# Patient Record
Sex: Male | Born: 1980 | Race: White | Hispanic: No | Marital: Married | Smoking: Former smoker
Health system: Southern US, Community
[De-identification: ages and names within clinical notes are randomized; demographics above are authoritative.]

## PROBLEM LIST (undated history)

## (undated) DIAGNOSIS — R51 Headache: Secondary | ICD-10-CM

## (undated) DIAGNOSIS — Z8249 Family history of ischemic heart disease and other diseases of the circulatory system: Secondary | ICD-10-CM

## (undated) DIAGNOSIS — I10 Essential (primary) hypertension: Secondary | ICD-10-CM

## (undated) DIAGNOSIS — J45909 Unspecified asthma, uncomplicated: Secondary | ICD-10-CM

## (undated) DIAGNOSIS — J309 Allergic rhinitis, unspecified: Secondary | ICD-10-CM

## (undated) DIAGNOSIS — Z83518 Family history of other specified eye disorder: Secondary | ICD-10-CM

## (undated) DIAGNOSIS — E785 Hyperlipidemia, unspecified: Secondary | ICD-10-CM

## (undated) DIAGNOSIS — Z973 Presence of spectacles and contact lenses: Secondary | ICD-10-CM

## (undated) DIAGNOSIS — R519 Headache, unspecified: Secondary | ICD-10-CM

## (undated) DIAGNOSIS — L80 Vitiligo: Secondary | ICD-10-CM

## (undated) HISTORY — DX: Allergic rhinitis, unspecified: J30.9

## (undated) HISTORY — DX: Vitiligo: L80

## (undated) HISTORY — DX: Unspecified asthma, uncomplicated: J45.909

## (undated) HISTORY — DX: Headache: R51

## (undated) HISTORY — DX: Family history of ischemic heart disease and other diseases of the circulatory system: Z82.49

## (undated) HISTORY — DX: Essential (primary) hypertension: I10

## (undated) HISTORY — DX: Family history of other specified eye disorder: Z83.518

## (undated) HISTORY — DX: Presence of spectacles and contact lenses: Z97.3

## (undated) HISTORY — PX: LACERATION REPAIR: SHX5168

## (undated) HISTORY — PX: DENTAL SURGERY: SHX609

## (undated) HISTORY — DX: Headache, unspecified: R51.9

## (undated) HISTORY — DX: Hyperlipidemia, unspecified: E78.5

---

## 2007-09-01 ENCOUNTER — Emergency Department (HOSPITAL_COMMUNITY): Admission: EM | Admit: 2007-09-01 | Discharge: 2007-09-02 | Payer: Self-pay | Admitting: Emergency Medicine

## 2011-11-20 ENCOUNTER — Ambulatory Visit (INDEPENDENT_AMBULATORY_CARE_PROVIDER_SITE_OTHER): Payer: BC Managed Care – PPO | Admitting: Family Medicine

## 2011-11-20 VITALS — BP 122/82 | HR 88 | Temp 98.5°F | Resp 16 | Ht 74.75 in | Wt 261.2 lb

## 2011-11-20 DIAGNOSIS — J029 Acute pharyngitis, unspecified: Secondary | ICD-10-CM

## 2011-11-20 LAB — POCT RAPID STREP A (OFFICE): Rapid Strep A Screen: NEGATIVE

## 2011-11-20 MED ORDER — CEFDINIR 300 MG PO CAPS: 300.0000 mg | ORAL_CAPSULE | Freq: Two times a day (BID) | ORAL | Status: AC

## 2011-11-20 NOTE — Progress Notes (Signed)
  Subjective:    Patient ID: Keyshon Stein, male    DOB: 1981-02-04, 31 y.o.   MRN: [id number]  HPI 31 yo male with bad sore throat since Tuesday.  Hurts bad to swallow.  Swollen glands, tender to touch.  No fever.  RUnny nose, cough last week but that is better.  H/O strep a couple years ago.  Similar to pain before.  Trid dayquil.    Review of Systems Negative except as per HPI     Objective:   Physical Exam  Constitutional: He appears well-developed. No distress.  HENT:  Right Ear: Tympanic membrane, external ear and ear canal normal. Tympanic membrane is not injected, not scarred, not perforated, not erythematous, not retracted and not bulging.  Left Ear: Tympanic membrane, external ear and ear canal normal. Tympanic membrane is not injected, not scarred, not perforated, not erythematous, not retracted and not bulging.  Nose: No mucosal edema or rhinorrhea. Right sinus exhibits no maxillary sinus tenderness and no frontal sinus tenderness. Left sinus exhibits no maxillary sinus tenderness and no frontal sinus tenderness.  Mouth/Throat: Uvula is midline and mucous membranes are normal. Posterior oropharyngeal erythema present. No oropharyngeal exudate or tonsillar abscesses.  Cardiovascular: Normal rate, regular rhythm, normal heart sounds and intact distal pulses.   No murmur heard. Pulmonary/Chest: Effort normal and breath sounds normal. No respiratory distress. He has no wheezes. He has no rales.  Lymphadenopathy:       Head (right side): No submandibular and no preauricular adenopathy present.       Head (left side): No submandibular and no preauricular adenopathy present.    He has cervical adenopathy.       Right cervical: Superficial cervical adenopathy present. No posterior cervical adenopathy present.      Left cervical: Superficial cervical adenopathy present. No posterior cervical adenopathy present.       Right: No supraclavicular adenopathy present.       Left: No  supraclavicular adenopathy present.  Skin: Skin is warm and dry.   Results for orders placed in visit on 11/20/11  POCT RAPID STREP A (OFFICE)      Component Value Range   Rapid Strep A Screen Negative  Negative           Assessment & Plan:  Pharyngitis - throat inflamed with tender LAD.  DUke's MMW and omnicef

## 2014-12-27 ENCOUNTER — Encounter: Payer: Self-pay | Admitting: Medical

## 2014-12-27 ENCOUNTER — Ambulatory Visit (INDEPENDENT_AMBULATORY_CARE_PROVIDER_SITE_OTHER): Payer: BC Managed Care – PPO | Admitting: Medical

## 2014-12-27 VITALS — BP 110/80 | HR 62 | Temp 98.1°F | Resp 16 | Ht 74.5 in | Wt 254.0 lb

## 2014-12-27 DIAGNOSIS — Z83518 Family history of other specified eye disorder: Secondary | ICD-10-CM | POA: Insufficient documentation

## 2014-12-27 DIAGNOSIS — Z23 Encounter for immunization: Secondary | ICD-10-CM | POA: Diagnosis not present

## 2014-12-27 DIAGNOSIS — E669 Obesity, unspecified: Secondary | ICD-10-CM | POA: Diagnosis not present

## 2014-12-27 DIAGNOSIS — Z0000 Encounter for general adult medical examination without abnormal findings: Secondary | ICD-10-CM

## 2014-12-27 DIAGNOSIS — Z87891 Personal history of nicotine dependence: Secondary | ICD-10-CM

## 2014-12-27 DIAGNOSIS — B356 Tinea cruris: Secondary | ICD-10-CM | POA: Diagnosis not present

## 2014-12-27 DIAGNOSIS — J4520 Mild intermittent asthma, uncomplicated: Secondary | ICD-10-CM | POA: Diagnosis not present

## 2014-12-27 DIAGNOSIS — M5440 Lumbago with sciatica, unspecified side: Secondary | ICD-10-CM

## 2014-12-27 DIAGNOSIS — Z8249 Family history of ischemic heart disease and other diseases of the circulatory system: Secondary | ICD-10-CM | POA: Diagnosis not present

## 2014-12-27 LAB — CBC
HCT: 43.4 % (ref 39.0–52.0)
Hemoglobin: 15.5 g/dL (ref 13.0–17.0)
MCH: 29.5 pg (ref 26.0–34.0)
MCHC: 35.7 g/dL (ref 30.0–36.0)
MCV: 82.7 fL (ref 78.0–100.0)
MPV: 10.4 fL (ref 8.6–12.4)
PLATELETS: 266 10*3/uL (ref 150–400)
RBC: 5.25 MIL/uL (ref 4.22–5.81)
RDW: 13.5 % (ref 11.5–15.5)
WBC: 7.5 10*3/uL (ref 4.0–10.5)

## 2014-12-27 LAB — POCT URINALYSIS DIPSTICK
Bilirubin, UA: NEGATIVE
Blood, UA: NEGATIVE
Glucose, UA: NEGATIVE
Ketones, UA: NEGATIVE
LEUKOCYTES UA: NEGATIVE
Nitrite, UA: NEGATIVE
PROTEIN UA: NEGATIVE
Spec Grav, UA: >=1.03
UROBILINOGEN UA: NEGATIVE
pH, UA: 6

## 2014-12-27 MED ORDER — CLOTRIMAZOLE-BETAMETHASONE 1-0.05 % EX CREA: 1.0000 "application " | TOPICAL_CREAM | Freq: Two times a day (BID) | CUTANEOUS | Status: DC

## 2014-12-27 MED ORDER — TERBINAFINE HCL 250 MG PO TABS: 250.0000 mg | ORAL_TABLET | Freq: Every day | ORAL | Status: DC

## 2014-12-27 MED ORDER — CYCLOBENZAPRINE HCL 10 MG PO TABS: 10.0000 mg | ORAL_TABLET | Freq: Every day | ORAL | Status: DC

## 2014-12-27 NOTE — Progress Notes (Signed)
Subjective:   HPI  Ronnie CaddySteven Bailey is a 34 y.o. male who presents for a complete physical.  New patient today.  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     Preventative care: Last physical or labs: new Sees dentist yearly: Yes Dr. Karl ItoMcCollum Last tetanus vaccine, TD or Tdap: 2010  eye- Dr. Lorin [facility]  Concerns: Back pain - had a party 11/25/14, and on 11/27/14, awoke with bad right leg pain, attributed to sciatica.  Has been up and down in severity but recently sleeping on air mattress at a friend;s in [place] really aggravated things.  Pain is low back radiates down both legs.   Jock itch- tried various OTC meds without relief  Reviewed their medical, surgical, family, social, medication, and allergy history and updated chart as appropriate.  Past Medical History  Diagnosis Date  . Hypertension     related to obesity at 290lb  . Allergic rhinitis   . Asthma     childhood mostly  . Wears glasses   . Headache   . Vitiligo     Past Surgical History  Procedure Laterality Date  . Laceration repair      History   Social History  . Marital Status: Married    Spouse Name: N/A  . Number of Children: N/A  . Years of Education: N/A   Occupational History  . Not on file.   Social History Main Topics  . Smoking status: Former Smoker -- 1.00 packs/day for 10 years    Quit date: 06/26/2007  . Smokeless tobacco: Not on file  . Alcohol Use: 3.6 oz/week    2 Glasses of wine, 2 Cans of beer, 2 Shots of liquor per week  . Drug Use: Yes    Special: Marijuana  . Sexual Activity: Not on file   Other Topics Concern  . 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  Exercise 2-5 days week 30-90 minutes, stretching, walking, running.      Family History  Problem Relation Age of Onset  . Diabetes Mother   . Hypotension Mother   . Other Mother     macular disease, genetic,  somewhat rare  . Heart disease Father [id number]    MI  . Hypertension Father   . Vitiligo Father   . Hypertension Brother   . Other Brother     eye disease  . Kidney disease Maternal Grandmother   . Diabetes Maternal Grandfather   . Cancer Maternal Grandfather     stomach  . Alzheimer's disease Paternal Grandmother   . Hypertension Brother      Current outpatient prescriptions:  .  clotrimazole-betamethasone (LOTRISONE) cream, Apply 1 application topically 2 (two) times daily., Disp: 30 g, Rfl: 0 .  cyclobenzaprine (FLEXERIL) 10 MG tablet, Take 1 tablet (10 mg total) by mouth at bedtime., Disp: 20 tablet, Rfl: 0 .  terbinafine (LAMISIL) 250 MG tablet, Take 1 tablet (250 mg total) by mouth daily., Disp: 14 tablet, Rfl: 0  Allergies  Allergen Reactions  . Oysters [Shellfish Allergy] Diarrhea    Stomach pain and diarrhea     Review of Systems Constitutional: -fever, -chills, -sweats, -unexpected weight change, -decreased appetite, -fatigue Allergy: -sneezing, -itching, +congestion Dermatology: -changing moles, --rash, -lumps ENT: -runny nose, -ear pain, -sore throat, -hoarseness, -sinus pain, -teeth pain, - ringing in ears, -hearing loss, -nosebleeds Cardiology: -chest pain, -palpitations, -swelling, -difficulty breathing  when lying flat, -waking up short of breath Respiratory: -cough, -shortness of breath, -difficulty breathing with exercise or exertion, -wheezing, -coughing up blood Gastroenterology: -abdominal pain, -nausea, -vomiting, -diarrhea, -constipation, -blood in stool, -changes in bowel movement, -difficulty swallowing or eating Hematology: -bleeding, -bruising  Musculoskeletal: -joint aches, -muscle aches, -joint swelling, +back pain, -neck pain, -cramping, -changes in gait Ophthalmology: denies vision changes, eye redness, itching, discharge Urology: -burning with urination, -difficulty urinating, -blood in urine, -urinary frequency, -urgency, -incontinence Neurology:  -headache, -weakness, -tingling, -numbness, -memory loss, -falls, -dizziness Psychology: -depressed mood, -agitation, +sleep problems     Objective:   Physical Exam  BP 110/80 mmHg  Pulse 62  Temp(Src) 98.1 F (36.7 C) (Oral)  Resp 16  Ht 6' 2.5" (1.892 m)  Wt 254 lb (115.214 kg)  BMI 32.19 kg/m2  General appearance: alert, no distress, WD/WN, obese white male Skin: patches of hyperpigmentation along lower abdomen, patches of hypopigmentation of perineum, scattered patches of extremities and torso, no specific worrisome lesions HEENT: normocephalic, conjunctiva/corneas normal, sclerae anicteric, PERRLA, EOMi, nares patent, no discharge or erythema, pharynx normal Oral cavity: MMM, tongue normal, teeth in good repair Neck: supple, no lymphadenopathy, no thyromegaly, no masses, normal ROM, no bruits Chest: non tender, normal shape and expansion Heart: RRR, normal S1, S2, no murmurs Lungs: CTA bilaterally, no wheezes, rhonchi, or rales Abdomen: +bs, soft, non tender, non distended, no masses, no hepatomegaly, no splenomegaly, no bruits Back: possible leftward scoliosis, asymmetry of back muscles with right side raised up with bent over tests, and crease asymmetry in low back, tender along right SI joint slightly, otherwise non tender, normal ROM Musculoskeletal: upper extremities non tender, no obvious deformity, normal ROM throughout, lower extremities non tender, no obvious deformity, normal ROM throughout Extremities: no edema, no cyanosis, no clubbing Pulses: 2+ symmetric, upper and lower extremities, normal cap refill Neurological: alert, oriented x 3, CN2-12 intact, strength normal upper extremities and lower extremities, sensation normal throughout, DTRs 2+ throughout, no cerebellar signs, gait normal Psychiatric: normal affect, behavior normal, pleasant  GU: normal male external genitalia, circumcised, irritated pink/red skin of scrotum and perineum, nontender, no masses, no  hernia, no lymphadenopathy Rectal: deferred due to age 34yo and no indication today   Assessment and Plan :    Encounter Diagnoses  Name Primary?  . Encounter for health maintenance examination in adult Yes  . Obesity   . Family history of premature coronary artery disease   . Family history of eye disease   . Midline low back pain with sciatica, sciatica laterality unspecified   . Need for prophylactic vaccination against Streptococcus pneumoniae (pneumococcus)   . Asthma in adult, mild intermittent, uncomplicated   . Former smoker   . Tinea cruris     Physical exam - discussed healthy lifestyle, diet, exercise, preventative care, vaccinations, and addressed their concerns.   See your dentist yearly for routine dental care including hygiene visits twice yearly. See your eye doctor yearly for routine vision care. Routine labs today.  Declines STD testing Discussed testicular cancer screening.  Vaccinations: Advised yearly flu shot. Counseled on the pneumococcal vaccine.  Vaccine information sheet given.  Pneumococcal vaccine PPSV 23 given after consent obtained.  Other concerns today: Back pain - discussed abnormal exam findings, stretching, and f/u pending labs  Tinea cruris - begin short term Lamisil and Lotrisone.  Discussed risks/benefits of medication, proper use  Follow up pending labs

## 2014-12-28 LAB — LIPID PANEL
Cholesterol: 138 mg/dL (ref 0–200)
HDL: 36 mg/dL — ABNORMAL LOW (ref >=40)
LDL CALC: 84 mg/dL (ref 0–99)
TRIGLYCERIDES: 89 mg/dL (ref <150)
Total CHOL/HDL Ratio: 3.8 Ratio
VLDL: 18 mg/dL (ref 0–40)

## 2014-12-28 LAB — COMPREHENSIVE METABOLIC PANEL
ALK PHOS: 38 U/L — AB (ref 39–117)
ALT: 28 U/L (ref 0–53)
AST: 22 U/L (ref 0–37)
Albumin: 4.6 g/dL (ref 3.5–5.2)
BILIRUBIN TOTAL: 0.4 mg/dL (ref 0.2–1.2)
BUN: 15 mg/dL (ref 6–23)
CO2: 27 mEq/L (ref 19–32)
CREATININE: 0.98 mg/dL (ref 0.50–1.35)
Calcium: 9.3 mg/dL (ref 8.4–10.5)
Chloride: 104 mEq/L (ref 96–112)
GLUCOSE: 78 mg/dL (ref 70–99)
Potassium: 4.3 mEq/L (ref 3.5–5.3)
Sodium: 139 mEq/L (ref 135–145)
Total Protein: 7.7 g/dL (ref 6.0–8.3)

## 2014-12-28 LAB — HEMOGLOBIN A1C
HEMOGLOBIN A1C: 5.3 % (ref <5.7)
MEAN PLASMA GLUCOSE: 105 mg/dL (ref <117)

## 2014-12-28 LAB — TSH: TSH: 2.96 u[IU]/mL (ref 0.350–4.500)

## 2015-01-09 ENCOUNTER — Encounter: Payer: Self-pay | Admitting: Medical

## 2015-11-14 ENCOUNTER — Ambulatory Visit (INDEPENDENT_AMBULATORY_CARE_PROVIDER_SITE_OTHER): Payer: BC Managed Care – PPO | Admitting: Family Medicine

## 2015-11-14 ENCOUNTER — Encounter: Payer: Self-pay | Admitting: Family Medicine

## 2015-11-14 VITALS — BP 142/88 | HR 72 | Resp 14 | Ht 74.5 in | Wt 267.8 lb

## 2015-11-14 DIAGNOSIS — B356 Tinea cruris: Secondary | ICD-10-CM | POA: Diagnosis not present

## 2015-11-14 MED ORDER — TERBINAFINE HCL 250 MG PO TABS: 250.0000 mg | ORAL_TABLET | Freq: Every day | ORAL | Status: DC

## 2015-11-14 NOTE — Progress Notes (Signed)
   Subjective:    Patient ID: Ronnie Bailey, male    DOB: January 31, 1981, 35 y.o.   MRN: [id number]  HPI He complains of pruritus and slight erythema in the umbilical area as well as elbows and groin area. He has had difficulty with this in the past and did respond to Lamisil as well as Lotrisone.   Review of Systems     Objective:   Physical Exam Slight drying and erythema is noted over both elbows and a erythematous dry lesion is noted in the periumbilical area. Exam of the scrotum does show evidence of vitiligo as well as slight erythema but no true dryness and evidence of tinea.       Assessment & Plan:  Tinea cruris - Plan: terbinafine (LAMISIL) 250 MG tablet I will treat him with Lamisil as it has worked in the past. He is also to use cortisone. If he does not improve, he is to call for referral.

## 2015-11-28 ENCOUNTER — Telehealth: Payer: Self-pay | Admitting: Family Medicine

## 2015-11-28 NOTE — Telephone Encounter (Signed)
Please call Wants derm referral got a little better in some spots but not all areas

## 2015-11-29 NOTE — Telephone Encounter (Signed)
Refer to dermatology 

## 2015-11-29 NOTE — Telephone Encounter (Signed)
Pt has an appt at [facility] with English Black  12/12/15 @ 9:10am. Pt is aware

## 2017-05-04 ENCOUNTER — Ambulatory Visit (INDEPENDENT_AMBULATORY_CARE_PROVIDER_SITE_OTHER): Payer: BLUE CROSS/BLUE SHIELD | Admitting: Medical

## 2017-05-04 ENCOUNTER — Encounter: Payer: Self-pay | Admitting: Medical

## 2017-05-04 VITALS — BP 128/86 | HR 59 | Wt 267.8 lb

## 2017-05-04 DIAGNOSIS — R221 Localized swelling, mass and lump, neck: Secondary | ICD-10-CM

## 2017-05-04 NOTE — Progress Notes (Addendum)
Subjective: Chief Complaint  Patient presents with  . swelling in neck    swelling, possible thyroid issues, sweating, rash x2 weeks    Here for swelling in neck, wonders about thyroid.  Feels hot all the time, gets sweaty at times.   Sometimes tightness in chest but not palpitations.   Has gained some weight, but diet not the best.  Could eat healthier.   No recent illness, no cough, no fever, no sore throat, no ear pain.  Feels a lump in area of thyroid.   Mother has hx/o thyroid problems.  No night sweats, no body ache or chills.  Has otherwise been in usual state of health.  No other aggravating or relieving factors. No other complaint.   Past Medical History:  Diagnosis Date  . Allergic rhinitis   . Asthma    childhood mostly  . Headache   . Hypertension    related to obesity at 290lb  . Vitiligo   . Wears glasses    Current Outpatient Prescriptions on File Prior to Visit  Medication Sig Dispense Refill  . clotrimazole-betamethasone (LOTRISONE) cream Apply 1 application topically 2 (two) times daily. 30 g 0   No current facility-administered medications on file prior to visit.    ROS as in subjective    Objective: BP 128/86   Pulse (!) 59   Wt 267 lb 12.8 oz (121.5 kg)   SpO2 98%   BMI 33.92 kg/m   Wt Readings from Last 3 Encounters:  05/04/17 267 lb 12.8 oz (121.5 kg)  11/14/15 267 lb 12.8 oz (121.5 kg)  12/27/14 254 lb (115.2 kg)   General appearance: alert, no distress, WD/WN,  HEENT: normocephalic, sclerae anicteric, TMs pearly, nares patent, no discharge or erythema, pharynx normal Oral cavity: MMM, no lesions Neck: right lateral neck with fullness anterior triangle of neck,  The fullness seems relatively small 1.5 cm diameter, somewhat roundish, but not obvious cyst or lymph node.  Otherwise  supple, no lymphadenopathy, no thyromegaly Chest: no other deformity, no supraclavicular nodes Heart: RRR, normal S1, S2, no murmurs Lungs: CTA bilaterally, no  wheezes, rhonchi, or rales Abdomen: +bs, soft, non tender, non distended, no masses, no hepatomegaly, no splenomegaly Pulses: 2+ symmetric, upper and lower extremities, normal cap refill     Assessment: Encounter Diagnoses  Name Primary?  . Mass of right side of neck Yes  . Localized swelling, mass or lump of neck      Plan: etiology unclear.  Possible cyst vs lymphadenopathy vs mass.   Labs today, tentatively set up for CT neck.      Viviann SpareSteven was seen today for swelling in neck.  Diagnoses and all orders for this visit:  Mass of right side of neck -     Basic metabolic panel -     CBC with Differential/Platelet -     TSH -     CT Soft Tissue Neck W Contrast; Future  Localized swelling, mass or lump of neck -     CT Soft Tissue Neck W Contrast; Future

## 2017-05-05 ENCOUNTER — Ambulatory Visit
Admission: RE | Admit: 2017-05-05 | Discharge: 2017-05-05 | Disposition: A | Discharge status: Home / Self Care | Payer: BLUE CROSS/BLUE SHIELD | Source: Ambulatory Visit | Attending: Medical | Admitting: Medical

## 2017-05-05 DIAGNOSIS — R221 Localized swelling, mass and lump, neck: Secondary | ICD-10-CM

## 2017-05-05 LAB — CBC WITH DIFFERENTIAL/PLATELET
Basophils Absolute: 30 cells/uL (ref 0–200)
Basophils Relative: 0.6 %
EOS PCT: 2.6 %
Eosinophils Absolute: 130 cells/uL (ref 15–500)
HCT: 40.3 % (ref 38.5–50.0)
HEMOGLOBIN: 14.1 g/dL (ref 13.2–17.1)
LYMPHS ABS: 1685 {cells}/uL (ref 850–3900)
MCH: 29.4 pg (ref 27.0–33.0)
MCHC: 35 g/dL (ref 32.0–36.0)
MCV: 84 fL (ref 80.0–100.0)
MPV: 11.5 fL (ref 7.5–12.5)
Monocytes Relative: 6.7 %
NEUTROS ABS: 2820 {cells}/uL (ref 1500–7800)
NEUTROS PCT: 56.4 %
Platelets: 203 10*3/uL (ref 140–400)
RBC: 4.8 10*6/uL (ref 4.20–5.80)
RDW: 13 % (ref 11.0–15.0)
Total Lymphocyte: 33.7 %
WBC mixed population: 335 cells/uL (ref 200–950)
WBC: 5 10*3/uL (ref 3.8–10.8)

## 2017-05-05 LAB — TSH: TSH: 2.85 m[IU]/L (ref 0.40–4.50)

## 2017-05-05 LAB — BASIC METABOLIC PANEL WITH GFR
BUN: 10 mg/dL (ref 7–25)
CO2: 27 mmol/L (ref 20–32)
Calcium: 9.1 mg/dL (ref 8.6–10.3)
Chloride: 108 mmol/L (ref 98–110)
Creat: 0.9 mg/dL (ref 0.60–1.35)
GFR, EST NON AFRICAN AMERICAN: 109 mL/min/{1.73_m2} (ref >=60)
GFR, Est African American: 126 mL/min/{1.73_m2} (ref >=60)
Glucose, Bld: 88 mg/dL (ref 65–99)
POTASSIUM: 4.5 mmol/L (ref 3.5–5.3)
SODIUM: 139 mmol/L (ref 135–146)

## 2017-05-05 MED ORDER — IOPAMIDOL (ISOVUE-300) INJECTION 61%
75.0000 mL | Freq: Once | INTRAVENOUS | Status: AC | PRN
  Administered 2017-05-05: 75 mL via INTRAVENOUS

## 2017-05-06 ENCOUNTER — Telehealth: Payer: Self-pay | Admitting: Medical

## 2017-05-06 ENCOUNTER — Other Ambulatory Visit: Payer: Self-pay | Admitting: Medical

## 2017-05-06 NOTE — Telephone Encounter (Signed)
   Please call re: Ct scan results

## 2017-05-08 NOTE — Telephone Encounter (Signed)
Spoke to patient about results.  He mentions some intermittent rashes he is getting, mild.  He will use benadryl QHS this next week, but if not improving or worse in coming week, recheck.  otherwise he plans to get physical here soon.    He notes father died in 76s of MI, stomach cancer runs in family.

## 2017-05-28 ENCOUNTER — Encounter: Payer: Self-pay | Admitting: Medical

## 2017-05-28 ENCOUNTER — Telehealth: Payer: Self-pay | Admitting: Medical

## 2017-05-28 NOTE — Telephone Encounter (Signed)
Pt called back to follow up on what was determined on his neck swelling because he would like an answer today. He said he is very uncomfortable.  Pt said the swelling feels more like it is muscular and located more behind his lymph nodes. He feels a slight lump but still he thinks it is all due to the muscle bulge behind his lymph nodes. He did try Ibuprofen every day for 2 weeks and it helped a little but pt has a family history of negative reactions to Ibuprofen (ulcers, bleeding). No fever, chills, appetite a little less than normal, sweats sometime when eating. Swelling feels as if he is choking sometime. Pt wants to be referred to an ENT asap. Pt understands he will be called back tomorrow.

## 2017-05-28 NOTE — Telephone Encounter (Signed)
The CT had shown several NOT enlarged lymph nodes.   Does he feel that he swelling or lump(s) are bigger or exactly the same?    Did he try using Ibuprofen for several days?      Does he have any other current symptoms besides the neck swelling such as fever, sweats, chills, decreased appetite?

## 2017-05-28 NOTE — Telephone Encounter (Signed)
Pt left message that neck swelling not going away after recommendations, what are next steps, please call back.

## 2017-05-29 NOTE — Telephone Encounter (Signed)
Refer to ENT

## 2017-05-29 NOTE — Telephone Encounter (Signed)
Forwarding to shane 

## 2017-06-03 NOTE — Telephone Encounter (Signed)
Sent referral on last Friday to ent  05/29/2017 and pt was notified of this

## 2017-06-04 ENCOUNTER — Telehealth: Payer: Self-pay | Admitting: Medical

## 2017-06-04 ENCOUNTER — Other Ambulatory Visit: Payer: Self-pay | Admitting: Medical

## 2017-06-04 ENCOUNTER — Other Ambulatory Visit: Payer: Self-pay

## 2017-06-04 DIAGNOSIS — R221 Localized swelling, mass and lump, neck: Secondary | ICD-10-CM

## 2017-06-04 MED ORDER — AMOXICILLIN-POT CLAVULANATE 875-125 MG PO TABS: 1.0000 | ORAL_TABLET | Freq: Two times a day (BID) | ORAL | 0 refills | Status: AC

## 2017-06-04 NOTE — Telephone Encounter (Signed)
Ronnie Bailey, check on referral.  He states he has not heard back from ENT

## 2017-06-08 NOTE — Telephone Encounter (Signed)
He has been set up for an appt pt is aware of this. I spoke with the pt last Wednesday gave the pt information about appt.

## 2018-04-22 ENCOUNTER — Encounter: Payer: Self-pay | Admitting: Medical

## 2018-04-22 ENCOUNTER — Ambulatory Visit: Payer: BLUE CROSS/BLUE SHIELD | Admitting: Medical

## 2018-04-22 VITALS — BP 130/80 | HR 56 | Temp 97.9°F | Resp 16 | Ht 75.0 in | Wt 229.4 lb

## 2018-04-22 DIAGNOSIS — K6289 Other specified diseases of anus and rectum: Secondary | ICD-10-CM | POA: Insufficient documentation

## 2018-04-22 DIAGNOSIS — Z8249 Family history of ischemic heart disease and other diseases of the circulatory system: Secondary | ICD-10-CM

## 2018-04-22 DIAGNOSIS — Z7189 Other specified counseling: Secondary | ICD-10-CM

## 2018-04-22 DIAGNOSIS — R9431 Abnormal electrocardiogram [ECG] [EKG]: Secondary | ICD-10-CM | POA: Insufficient documentation

## 2018-04-22 DIAGNOSIS — Z83518 Family history of other specified eye disorder: Secondary | ICD-10-CM

## 2018-04-22 DIAGNOSIS — Z23 Encounter for immunization: Secondary | ICD-10-CM | POA: Diagnosis not present

## 2018-04-22 DIAGNOSIS — Z0000 Encounter for general adult medical examination without abnormal findings: Secondary | ICD-10-CM

## 2018-04-22 DIAGNOSIS — Z7185 Encounter for immunization safety counseling: Secondary | ICD-10-CM

## 2018-04-22 DIAGNOSIS — R079 Chest pain, unspecified: Secondary | ICD-10-CM | POA: Insufficient documentation

## 2018-04-22 LAB — POCT URINALYSIS DIP (PROADVANTAGE DEVICE)
Bilirubin, UA: NEGATIVE
Glucose, UA: NEGATIVE mg/dL
Ketones, POC UA: NEGATIVE mg/dL
Leukocytes, UA: NEGATIVE
NITRITE UA: NEGATIVE
PH UA: 6 (ref 5.0–8.0)
PROTEIN UA: NEGATIVE mg/dL
RBC UA: NEGATIVE
SPECIFIC GRAVITY, URINE: 1.025
UUROB: NEGATIVE

## 2018-04-22 MED ORDER — HYDROCORTISONE 2.5 % RE CREA: 1.0000 "application " | TOPICAL_CREAM | Freq: Two times a day (BID) | RECTAL | 0 refills | Status: DC

## 2018-04-22 NOTE — Patient Instructions (Addendum)
Thanks for trusting [place] with your health care and for coming in for a physical today.  Below are some general recommendations I have for you:  Yearly screenings See your eye doctor yearly for routine vision care. See your dentist yearly for routine dental care including hygiene visits twice yearly. See me here yearly for a routine physical and preventative care visit   Specific Concerns today:  . We will refer you to cardiology given abnormal EKG and family history of premature heart disease . Glad to see you have lost weight and made healthy lifestyle changes . You may have been experiencing internal hemorrhoid or fissure . When you have symptoms, use salt water soap bath soaks 20 minutes nightly for several days, avoid straining, heavy lifting etc, and you can use short term Hydrocortisone cream prescribed today topically at rectum for 4-7 days at a time . If you continue to see blood in stool or have problems, you may need to see gastroenterology . Consider taking a probiotic daily . Avoid foods that cause you to have constipation or diarrhea   Please follow up yearly for a physical.   Preventative Care for Adults - Male      MAINTAIN REGULAR HEALTH EXAMS:  A routine yearly physical is a good way to check in with your primary care provider about your health and preventive screening. It is also an opportunity to share updates about your health and any concerns you have, and receive a thorough all-over exam.   Most health insurance companies pay for at least some preventative services.  Check with your health plan for specific coverages.  WHAT PREVENTATIVE SERVICES DO WOMEN NEED?  Adult men should have their weight and blood pressure checked regularly.   Men age 72 and older should have their cholesterol levels checked regularly.  Beginning at age 35 and continuing to age 59, men should be screened for colorectal cancer.  Certain people may need continued testing until age  35.  Updating vaccinations is part of preventative care.  Vaccinations help protect against diseases such as the flu.  Osteoporosis is a disease in which the bones lose minerals and strength as we age. Men ages 64 and over should discuss this with their caregivers  Lab tests are generally done as part of preventative care to screen for anemia and blood disorders, to screen for problems with the kidneys and liver, to screen for bladder problems, to check blood sugar, and to check your cholesterol level.  Preventative services generally include counseling about diet, exercise, avoiding tobacco, drugs, excessive alcohol consumption, and sexually transmitted infections.    GENERAL RECOMMENDATIONS FOR GOOD HEALTH:  Healthy diet:  Eat a variety of foods, including fruit, vegetables, animal or vegetable protein, such as meat, fish, chicken, and eggs, or beans, lentils, tofu, and grains, such as rice.  Drink plenty of water daily.  Decrease saturated fat in the diet, avoid lots of red meat, processed foods, sweets, fast foods, and fried foods.  Exercise:  Aerobic exercise helps maintain good heart health. At least 30-40 minutes of moderate-intensity exercise is recommended. For example, a brisk walk that increases your heart rate and breathing. This should be done on most days of the week.   Find a type of exercise or a variety of exercises that you enjoy so that it becomes a part of your daily life.  Examples are running, walking, swimming, water aerobics, and biking.  For motivation and support, explore group exercise such as aerobic class, spin  class, Zumba, Yoga,or  martial arts, etc.    Set exercise goals for yourself, such as a certain weight goal, walk or run in a race such as a 5k walk/run.  Speak to your primary care provider about exercise goals.  Disease prevention:  If you smoke or chew tobacco, find out from your caregiver how to quit. It can literally save your life, no matter how  long you have been a tobacco user. If you do not use tobacco, never begin.   Maintain a healthy diet and normal weight. Increased weight leads to problems with blood pressure and diabetes.   The Body Mass Index or BMI is a way of measuring how much of your body is fat. Having a BMI above 27 increases the risk of heart disease, diabetes, hypertension, stroke and other problems related to obesity. Your caregiver can help determine your BMI and based on it develop an exercise and dietary program to help you achieve or maintain this important measurement at a healthful level.  High blood pressure causes heart and blood vessel problems.  Persistent high blood pressure should be treated with medicine if weight loss and exercise do not work.   Fat and cholesterol leaves deposits in your arteries that can block them. This causes heart disease and vessel disease elsewhere in your body.  If your cholesterol is found to be high, or if you have heart disease or certain other medical conditions, then you may need to have your cholesterol monitored frequently and be treated with medication.   Ask if you should have a cardiac stress test if your history suggests this. A stress test is a test done on a treadmill that looks for heart disease. This test can find disease prior to there being a problem.  Osteoporosis is a disease in which the bones lose minerals and strength as we age. This can result in serious bone fractures. Risk of osteoporosis can be identified using a bone density scan. Men ages 54 and over should discuss this with their caregivers. Ask your caregiver whether you should be taking a calcium supplement and Vitamin D, to reduce the rate of osteoporosis.   Avoid drinking alcohol in excess (more than two drinks per day).  Avoid use of street drugs. Do not share needles with anyone. Ask for professional help if you need assistance or instructions on stopping the use of alcohol, cigarettes, and/or  drugs.  Brush your teeth twice a day with fluoride toothpaste, and floss once a day. Good oral hygiene prevents tooth decay and gum disease. The problems can be painful, unattractive, and can cause other health problems. Visit your dentist for a routine oral and dental check up and preventive care every 6-12 months.   Look at your skin regularly.  Use a mirror to look at your back. Notify your caregivers of changes in moles, especially if there are changes in shapes, colors, a size larger than a pencil eraser, an irregular border, or development of new moles.  Safety:  Use seatbelts 100% of the time, whether driving or as a passenger.  Use safety devices such as hearing protection if you work in environments with loud noise or significant background noise.  Use safety glasses when doing any work that could send debris in to the eyes.  Use a helmet if you ride a bike or motorcycle.  Use appropriate safety gear for contact sports.  Talk to your caregiver about gun safety.  Use sunscreen with a SPF (or skin protection  factor) of 15 or greater.  Lighter skinned people are at a greater risk of skin cancer. Don't forget to also wear sunglasses in order to protect your eyes from too much damaging sunlight. Damaging sunlight can accelerate cataract formation.   Practice safe sex. Use condoms. Condoms are used for birth control and to help reduce the spread of sexually transmitted infections (or STIs).  Some of the STIs are gonorrhea (the clap), chlamydia, syphilis, trichomonas, herpes, HPV (human papilloma virus) and HIV (human immunodeficiency virus) which causes AIDS. The herpes, HIV and HPV are viral illnesses that have no cure. These can result in disability, cancer and death.   Keep carbon monoxide and smoke detectors in your home functioning at all times. Change the batteries every 6 months or use a model that plugs into the wall.   Vaccinations:  Stay up to date with your tetanus shots and other  required immunizations. You should have a booster for tetanus every 10 years. Be sure to get your flu shot every year, since 5%-20% of the [place] population comes down with the flu. The flu vaccine changes each year, so being vaccinated once is not enough. Get your shot in the fall, before the flu season peaks.   Other vaccines to consider:  Human Papilloma Virus or HPV causes cancer of the cervix, and other infections that can be transmitted from person to person. There is a vaccine for HPV, and males should get immunized between the ages of 55 and 81. It requires a series of 3 shots.   Pneumococcal vaccine to protect against certain types of pneumonia.  This is normally recommended for adults age 31 or older.  However, adults younger than 38 years old with certain underlying conditions such as diabetes, heart or lung disease should also receive the vaccine.  Shingles vaccine to protect against Varicella Zoster if you are older than age 56, or younger than 38 years old with certain underlying illness.  If you have not had the Shingrix vaccine, please call your insurer to inquire about coverage for the Shingrix vaccine given in 2 doses.   Some insurers cover this vaccine after age 92, some cover this after age 82.  If your insurer covers this, then call to schedule appointment to have this vaccine here  Hepatitis A vaccine to protect against a form of infection of the liver by a virus acquired from food.  Hepatitis B vaccine to protect against a form of infection of the liver by a virus acquired from blood or body fluids, particularly if you work in health care.  If you plan to travel internationally, check with your local health department for specific vaccination recommendations.   What should I know about cancer screening? Many types of cancers can be detected early and may often be prevented. Lung Cancer  You should be screened every year for lung cancer if: ? You are a current smoker who has  smoked for at least 30 years. ? You are a former smoker who has quit within the past 15 years.  Talk to your health care provider about your screening options, when you should start screening, and how often you should be screened.  Colorectal Cancer  Routine colorectal cancer screening usually begins at 38 years of age and should be repeated every 5-10 years until you are 38 years old. You may need to be screened more often if early forms of precancerous polyps or small growths are found. Your health care provider may recommend screening  at an earlier age if you have risk factors for colon cancer.  Your health care provider may recommend using home test kits to check for hidden blood in the stool.  A small camera at the end of a tube can be used to examine your colon (sigmoidoscopy or colonoscopy). This checks for the earliest forms of colorectal cancer.  Prostate and Testicular Cancer  Depending on your age and overall health, your health care provider may do certain tests to screen for prostate and testicular cancer.  Talk to your health care provider about any symptoms or concerns you have about testicular or prostate cancer.  Skin Cancer  Check your skin from head to toe regularly.  Tell your health care provider about any new moles or changes in moles, especially if: ? There is a change in a mole's size, shape, or color. ? You have a mole that is larger than a pencil eraser.  Always use sunscreen. Apply sunscreen liberally and repeat throughout the day.  Protect yourself by wearing long sleeves, pants, a wide-brimmed hat, and sunglasses when outside.

## 2018-04-22 NOTE — Progress Notes (Signed)
Subjective:   HPI  Ronnie Bailey is a 37 y.o. male who presents for Chief Complaint  Patient presents with  . CPE    non fasting  hemrrhoids, tightness in the chest   Medical care team includes: Tysinger, Kermit Balo, PA-C here for primary care Dentist Eye doctor D[facility], [place], [place]  Concerns: Gets occasional dull chest pain, left sided.  Chest pain seems random, not necessarily worse with activity. Not associated with nausea, sweats, SOB.  No DOE.    Exercise - walking 30-45 minutes daily around neighborhood.   Doing some calisthenics regular  Gets random pains in weird places, arms, or legs, short lived.     Mom has hx/o Tic delareaux  Getting some rectal discomfort like  Hemorrhoid if eating worse/junk food.  This improves with healthy eating. Has had few times of small amount of blood on tissue, attributed to hemorrhoid  Reviewed their medical, surgical, family, social, medication, and allergy history and updated chart as appropriate.  Past Medical History:  Diagnosis Date  . Allergic rhinitis   . Asthma    childhood mostly  . Family history of macular degeneration   . Family history of premature CAD   . Headache    occasional  . Hypertension    related to obesity at 290lb  . Vitiligo   . Wears glasses     Past Surgical History:  Procedure Laterality Date  . LACERATION REPAIR      Social History   Socioeconomic History  . Marital status: Married    Spouse name: Not on file  . Number of children: Not on file  . Years of education: Not on file  . Highest education level: Not on file  Occupational History  . Not on file  Social Needs  . Financial resource strain: Not on file  . Food insecurity:    Worry: Not on file    Inability: Not on file  . Transportation needs:    Medical: Not on file    Non-medical: Not on file  Tobacco Use  . Smoking status: Former Smoker    Packs/day: 1.00    Years: 10.00    Pack years: 10.00    Last  attempt to quit: 06/26/2007    Years since quitting: 10.8  . Smokeless tobacco: Never Used  Substance and Sexual Activity  . Alcohol use: Yes    Alcohol/week: 6.0 standard drinks    Types: 2 Glasses of wine, 2 Cans of beer, 2 Shots of liquor per week  . Drug use: Yes    Types: Marijuana  . Sexual activity: Not on file  Lifestyle  . Physical activity:    Days per week: Not on file    Minutes per session: Not on file  . Stress: Not on file  Relationships  . Social connections:    Talks on phone: Not on file    Gets together: Not on file    Attends religious service: Not on file    Active member of club or organization: Not on file    Attends meetings of clubs or organizations: Not on file    Relationship status: Not on file  . Intimate partner violence:    Fear of current or ex partner: Not on file    Emotionally abused: Not on file    Physically abused: Not on file    Forced sexual activity: Not on file  Other Topics Concern  . 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  Exercise 2-5 days week 30-90 minutes, stretching, walking, running.  03/2018    Family History  Problem Relation Age of Onset  . Diabetes Mother   . Hypotension Mother   . Other Mother        macular disease, genetic, somewhat rare  . Heart disease Father 25       MI  . Hypertension Father   . Vitiligo Father   . Hypertension Brother   . Other Brother        eye disease  . Hypertension Brother   . Kidney disease Maternal Grandmother   . Diabetes Maternal Grandfather   . Cancer Maternal Grandfather        stomach  . Alzheimer's disease Paternal Grandmother      Current Outpatient Medications:  .  Clocortolone Pivalate (CLODERM) 0.1 % cream, Apply 1 application topically 2 (two) times daily., Disp: , Rfl:   Allergies  Allergen Reactions  . Oysters [Shellfish Allergy] Diarrhea    Stomach pain and diarrhea        Review of Systems Constitutional: -fever, -chills, -sweats, -unexpected weight change, -decreased appetite, -fatigue Allergy: -sneezing, -itching, -congestion Dermatology: -changing moles, --rash, -lumps ENT: -runny nose, -ear pain, -sore throat, -hoarseness, -sinus pain, -teeth pain, - ringing in ears, -hearing loss, -nosebleeds Cardiology: +chest pain, -palpitations, -swelling, -difficulty breathing when lying flat, -waking up short of breath Respiratory: -cough, -shortness of breath, -difficulty breathing with exercise or exertion, -wheezing, -coughing up blood Gastroenterology: -abdominal pain, -nausea, -vomiting, +diarrhea, +constipation, +blood in stool, -changes in bowel movement, -difficulty swallowing or eating Hematology: -bleeding, -bruising  Musculoskeletal: -joint aches, -muscle aches, -joint swelling, -back pain, -neck pain, -cramping, -changes in gait Ophthalmology: denies vision changes, eye redness, itching, discharge Urology: -burning with urination, -difficulty urinating, -blood in urine, -urinary frequency, -urgency, -incontinence Neurology: -headache, -weakness, -tingling, -numbness, -memory loss, -falls, -dizziness Psychology: -depressed mood, -agitation, -sleep problems Male GU: no testicular mass, pain, no lymph nodes swollen, no swelling, no rash.     Objective:  BP 130/80   Pulse (!) 56   Temp 97.9 F (36.6 C) (Oral)   Resp 16   Ht 6\' 3"  (1.905 m)   Wt 229 lb 6.4 oz (104.1 kg)   SpO2 97%   BMI 28.67 kg/m   General appearance: alert, no distress, WD/WN, Caucasian male Skin: scattered macules, patches of hypopigmentation c/w vitiligo HEENT: normocephalic, conjunctiva/corneas normal, sclerae anicteric, PERRLA, EOMi, nares patent, no discharge or erythema, pharynx normal Oral cavity: MMM, tongue normal, teeth normal Neck: supple, no lymphadenopathy, no thyromegaly, no masses, normal ROM, no bruits Chest: non tender, normal shape and  expansion Heart: RRR, normal S1, S2, no murmurs Lungs: CTA bilaterally, no wheezes, rhonchi, or rales Abdomen: +bs, soft, non tender, non distended, no masses, no hepatomegaly, no splenomegaly, no bruits Back: non tender, normal ROM, no scoliosis Musculoskeletal: upper extremities non tender, no obvious deformity, normal ROM throughout, lower extremities non tender, no obvious deformity, normal ROM throughout Extremities: no edema, no cyanosis, no clubbing Pulses: 2+ symmetric, upper and lower extremities, normal cap refill Neurological: alert, oriented x 3, CN2-12 intact, strength normal upper extremities and lower extremities, sensation normal throughout, DTRs 2+ throughout, no cerebellar signs, gait normal Psychiatric: normal affect, behavior normal, pleasant  GU: normal male external genitalia,circumcised, nontender, no masses, no hernia, no lymphadenopathy Rectal: no obvious deformity or hemorrhoid or fissure, anus normal tone ,prostate WNL   Adult ECG Report  Indication: chest pain  Rate: 65 bpm  Rhythm: normal sinus rhythm  QRS Axis: -45 degrees  PR Interval: 202 ms  QRS Duration: 118ms  QTc: 438ms  Conduction Disturbances: possible LVH, widening of QRS, LAFB  Other Abnormalities: none  Patient's cardiac risk factors are: family history of premature cardiovascular disease and male gender.  EKG comparison: widened QRS I compared to prior EKG  Narrative Interpretation: abnormal EKG     Assessment and Plan :   Encounter Diagnoses  Name Primary?  . Routine general medical examination at a health care facility Yes  . Family history of premature coronary artery disease   . Family history of macular degeneration   . Vaccine counseling   . Chest pain, unspecified type   . Abnormal EKG   . Need for Tdap vaccination   . Rectal discomfort     Physical exam - discussed and counseled on healthy lifestyle, diet, exercise, preventative care, vaccinations, sick and well care,  proper use of emergency dept and after hours care, and addressed their concerns.    Health screening: See your eye doctor yearly for routine vision care. See your dentist yearly for routine dental care including hygiene visits twice yearly.  Cancer screening Advised monthly self testicular exam  Vaccinations: Advised yearly influenza vaccine Patients gets this free at another clinic  Counseled on the Tdap (tetanus, diptheria, and acellular pertussis) vaccine.  Vaccine information sheet given. Tdap vaccine given after consent obtained.  Separate significant chronic issues discussed: Specific Concerns today:  . We will refer you to cardiology given abnormal EKG and family history of premature heart disease . Glad to see you have lost weight and made healthy lifestyle changes . You may have been experiencing internal hemorrhoid or fissure . When you have symptoms, use salt water soap bath soaks 20 minutes nightly for several days, avoid straining, heavy lifting etc, and you can use short term Hydrocortisone cream prescribed today topically at rectum for 4-7 days at a time . If you continue to see blood in stool or have problems, you may need to see gastroenterology . Consider taking a probiotic daily . Avoid foods that cause you to have constipation or diarrhea  Viviann SpareSteven was seen today for cpe.  Diagnoses and all orders for this visit:  Routine general medical examination at a health care facility -     POCT Urinalysis DIP (Proadvantage Device) -     Comprehensive metabolic panel -     CBC with Differential/Platelet -     Lipid panel -     Hemoglobin A1c -     TSH -     EKG 12-Lead -     Ambulatory referral to Cardiology  Family history of premature coronary artery disease -     Ambulatory referral to Cardiology  Family history of macular degeneration  Vaccine counseling  Chest pain, unspecified type -     Ambulatory referral to Cardiology  Abnormal EKG -     Ambulatory  referral to Cardiology  Need for Tdap vaccination  Rectal discomfort   Follow-up pending labs, yearly for physical

## 2018-04-23 LAB — CBC WITH DIFFERENTIAL/PLATELET
BASOS: 0 %
Basophils Absolute: 0 10*3/uL (ref 0.0–0.2)
EOS (ABSOLUTE): 0.1 10*3/uL (ref 0.0–0.4)
EOS: 1 %
HEMATOCRIT: 43.7 % (ref 37.5–51.0)
HEMOGLOBIN: 15.5 g/dL (ref 13.0–17.7)
IMMATURE GRANS (ABS): 0 10*3/uL (ref 0.0–0.1)
IMMATURE GRANULOCYTES: 0 %
LYMPHS: 33 %
Lymphocytes Absolute: 2.1 10*3/uL (ref 0.7–3.1)
MCH: 30.3 pg (ref 26.6–33.0)
MCHC: 35.5 g/dL (ref 31.5–35.7)
MCV: 85 fL (ref 79–97)
MONOCYTES: 7 %
Monocytes Absolute: 0.5 10*3/uL (ref 0.1–0.9)
NEUTROS ABS: 3.7 10*3/uL (ref 1.4–7.0)
NEUTROS PCT: 59 %
PLATELETS: 275 10*3/uL (ref 150–450)
RBC: 5.12 x10E6/uL (ref 4.14–5.80)
RDW: 13.2 % (ref 12.3–15.4)
WBC: 6.3 10*3/uL (ref 3.4–10.8)

## 2018-04-23 LAB — LIPID PANEL
CHOL/HDL RATIO: 3.5 ratio (ref 0.0–5.0)
Cholesterol, Total: 155 mg/dL (ref 100–199)
HDL: 44 mg/dL (ref >39)
LDL CALC: 91 mg/dL (ref 0–99)
Triglycerides: 99 mg/dL (ref 0–149)
VLDL CHOLESTEROL CAL: 20 mg/dL (ref 5–40)

## 2018-04-23 LAB — COMPREHENSIVE METABOLIC PANEL WITH GFR
ALT: 22 IU/L (ref 0–44)
AST: 22 IU/L (ref 0–40)
Albumin/Globulin Ratio: 1.9 (ref 1.2–2.2)
Albumin: 4.9 g/dL (ref 3.5–5.5)
Alkaline Phosphatase: 44 IU/L (ref 39–117)
BUN/Creatinine Ratio: 11 (ref 9–20)
BUN: 13 mg/dL (ref 6–20)
Bilirubin Total: 0.7 mg/dL (ref 0.0–1.2)
CO2: 24 mmol/L (ref 20–29)
Calcium: 10.1 mg/dL (ref 8.7–10.2)
Chloride: 103 mmol/L (ref 96–106)
Creatinine, Ser: 1.15 mg/dL (ref 0.76–1.27)
GFR calc Af Amer: 93 mL/min/1.73
GFR calc non Af Amer: 80 mL/min/1.73
Globulin, Total: 2.6 g/dL (ref 1.5–4.5)
Glucose: 82 mg/dL (ref 65–99)
Potassium: 4.8 mmol/L (ref 3.5–5.2)
Sodium: 142 mmol/L (ref 134–144)
Total Protein: 7.5 g/dL (ref 6.0–8.5)

## 2018-04-23 LAB — TSH: TSH: 2.69 u[IU]/mL (ref 0.450–4.500)

## 2018-04-23 LAB — HEMOGLOBIN A1C
Est. average glucose Bld gHb Est-mCnc: 100 mg/dL
Hgb A1c MFr Bld: 5.1 % (ref 4.8–5.6)

## 2018-06-09 NOTE — Progress Notes (Signed)
Cardiology Office Note   Date:  06/10/2018   ID:  Ronnie Bailey, DOB 1980/09/09, MRN [id number]  PCP:  Jac Canavan, PA-C  Cardiologist:   Charlton Haws, MD   No chief complaint on file.     History of Present Illness: Ronnie Bailey is a 37 y.o. male who presents for consultation regarding chest pain abnormal ECG and family history of CAD.  SSCP random not exertional dull and left sided Does walk daily without issues Also random pains in arms and legs Former smoker and HTN related to obesity Father had MI at age 61 ECG from 04/22/18 reviewed and showed SR with LAFB with LVH.    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 Allergic rhinitis   . Asthma    childhood mostly  . Family history of macular degeneration   . Family history of premature CAD   . Headache    occasional  . Hypertension    related to obesity at 290lb  . Vitiligo   . Wears glasses     Past Surgical History:  Procedure Laterality Date  . LACERATION REPAIR       Current Outpatient Medications  Medication Sig Dispense Refill  . Clocortolone Pivalate (CLODERM) 0.1 % cream Apply 1 application topically 2 (two) times daily.    . hydrocortisone (ANUSOL-HC) 2.5 % rectal cream Place 1 application rectally 2 (two) times daily. 30 g 0   No current facility-administered medications for this visit.     Allergies:   Oysters [shellfish allergy]    Social History:  The patient  reports that he quit smoking about 10 years ago. He has a 10.00 pack-year smoking history. He has never used smokeless tobacco. He reports that he drinks about 6.0 standard drinks of alcohol per week. He reports that he has current or past drug history. Drug: Marijuana.   Family History:  The patient's family  history includes Alzheimer's disease in his paternal grandmother; Cancer in his maternal grandfather; Diabetes in his maternal grandfather and mother; Heart disease (age of onset: 73) in his father; Hypertension in his brother, brother, and father; Hypotension in his mother; Kidney disease in his maternal grandmother; Other in his brother and mother; Vitiligo in his father.    ROS:  Please see the history of present illness.   Otherwise, review of systems are positive for none.   All other systems are reviewed and negative.    PHYSICAL EXAM: VS:  BP 122/75   Pulse 60   Ht 6\' 3"  (1.905 m)   Wt 238 lb (108 kg)   SpO2 99%   BMI 29.75 kg/m  , BMI Body mass index is 29.75 kg/m. Affect appropriate Healthy:  appears stated age HEENT: normal Neck supple with no adenopathy JVP normal no bruits no thyromegaly Lungs clear with no wheezing and good diaphragmatic motion Heart:  S1/S2 no murmur, no rub, gallop or click PMI normal Abdomen: benighn, BS positve, no tenderness, no AAA no bruit.  No HSM or HJR Distal pulses intact with no bruits No edema Neuro non-focal Skin warm and dry No muscular weakness    EKG:  See HPI   Recent Labs: 04/22/2018: ALT 22; BUN 13; Creatinine, Ser 1.15; Hemoglobin 15.5; Platelets 275; Potassium  4.8; Sodium 142; TSH 2.690    Lipid Panel    Component Value Date/Time   CHOL 155 04/22/2018 1013   TRIG 99 04/22/2018 1013   HDL 44 04/22/2018 1013   CHOLHDL 3.5 04/22/2018 1013   CHOLHDL 3.8 12/27/2014 0001   VLDL 18 12/27/2014 0001   LDLCALC 91 04/22/2018 1013      Wt Readings from Last 3 Encounters:  06/10/18 238 lb (108 kg)  04/22/18 229 lb 6.4 oz (104.1 kg)  05/04/17 267 lb 12.8 oz (121.5 kg)      Other studies Reviewed: Additional studies/ records that were reviewed today include: Notes primary ECG labs .    ASSESSMENT AND PLAN:  1.  HTN:  Well controlled.  Continue current medications and low sodium Dash type diet.   2. Abnormal ECG :   LAFB LVH suggest yearly ECG  3. Family history premature CAD discussed utility of ETT and calcium score to risk stratify     Current medicines are reviewed at length with the patient today.  The patient does not have concerns regarding medicines.  The following changes have been made:  no change  Labs/ tests ordered today include: ETT  Coronary Calcium score   Orders Placed This Encounter  Procedures  . CT CARDIAC SCORING  . EXERCISE TOLERANCE TEST (ETT)     Disposition:   FU with cardiology PRN      Signed, Charlton Haws, MD  06/10/2018 9:18 AM    [facility] [address] [place], [place], [place]  [address] Phone: [phone]; Fax: [phone]

## 2018-06-10 ENCOUNTER — Encounter: Payer: Self-pay | Admitting: Cardiovascular Disease

## 2018-06-10 ENCOUNTER — Ambulatory Visit: Payer: BLUE CROSS/BLUE SHIELD | Admitting: Cardiovascular Disease

## 2018-06-10 VITALS — BP 122/75 | HR 60 | Ht 75.0 in | Wt 238.0 lb

## 2018-06-10 DIAGNOSIS — I10 Essential (primary) hypertension: Secondary | ICD-10-CM | POA: Diagnosis not present

## 2018-06-10 DIAGNOSIS — Z8249 Family history of ischemic heart disease and other diseases of the circulatory system: Secondary | ICD-10-CM | POA: Diagnosis not present

## 2018-06-10 NOTE — Patient Instructions (Signed)
Medication Instructions:   If you need a refill on your cardiac medications before your next appointment, please call your pharmacy.   Lab work:  If you have labs (blood work) drawn today and your tests are completely normal, you will receive your results only by: Marland Kitchen MyChart Message (if you have MyChart) OR . A paper copy in the mail If you have any lab test that is abnormal or we need to change your treatment, we will call you to review the results.  Testing/Procedures: Your physician has requested that you have an exercise tolerance test. For further information please visit [email or web address] Please also follow instruction sheet, as given.  Cardiac CT scanning for Calcium Score, (CAT scanning), is a noninvasive, special x-ray that produces cross-sectional images of the body using x-rays and a computer. CT scans help physicians diagnose and treat medical conditions. For some CT exams, a contrast material is used to enhance visibility in the area of the body being studied. CT scans provide greater clarity and reveal more details than regular x-ray exams.  Follow-Up: Your physician recommends that you follow-up as needed with Dr. Eden Emms.

## 2018-06-22 ENCOUNTER — Ambulatory Visit (INDEPENDENT_AMBULATORY_CARE_PROVIDER_SITE_OTHER)
Admission: RE | Admit: 2018-06-22 | Discharge: 2018-06-22 | Disposition: A | Discharge status: Home / Self Care | Payer: BLUE CROSS/BLUE SHIELD | Source: Ambulatory Visit | Attending: Cardiovascular Disease | Admitting: Cardiovascular Disease

## 2018-06-22 ENCOUNTER — Telehealth: Payer: Self-pay

## 2018-06-22 ENCOUNTER — Ambulatory Visit (INDEPENDENT_AMBULATORY_CARE_PROVIDER_SITE_OTHER): Payer: BLUE CROSS/BLUE SHIELD

## 2018-06-22 DIAGNOSIS — I10 Essential (primary) hypertension: Secondary | ICD-10-CM

## 2018-06-22 DIAGNOSIS — Z8249 Family history of ischemic heart disease and other diseases of the circulatory system: Secondary | ICD-10-CM

## 2018-06-22 DIAGNOSIS — R931 Abnormal findings on diagnostic imaging of heart and coronary circulation: Secondary | ICD-10-CM

## 2018-06-22 LAB — EXERCISE TOLERANCE TEST
CHL CUP MPHR: 182 {beats}/min
CHL CUP RESTING HR STRESS: 63 {beats}/min
CSEPHR: 96 %
Estimated workload: 15.2 METS
Exercise duration (min): 13 min
Exercise duration (sec): 0 s
Peak HR: 176 {beats}/min
RPE: 18

## 2018-06-22 NOTE — Telephone Encounter (Signed)
Left message for patient to call back  

## 2018-06-22 NOTE — Telephone Encounter (Signed)
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

## 2018-06-23 MED ORDER — ATORVASTATIN CALCIUM 10 MG PO TABS: 10.0000 mg | ORAL_TABLET | ORAL | 3 refills | Status: DC

## 2018-06-23 MED ORDER — ASPIRIN EC 81 MG PO TBEC: 81.0000 mg | DELAYED_RELEASE_TABLET | Freq: Every day | ORAL | Status: DC

## 2018-06-23 NOTE — Addendum Note (Signed)
Addended by: Virl Axe, Armie Moren L on: 06/23/2018 01:44 PM   Modules accepted: Orders

## 2018-06-23 NOTE — Telephone Encounter (Signed)
Left message for patient to call back  

## 2018-06-23 NOTE — Telephone Encounter (Signed)
Called patient with results. Per Dr. 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 Patient will start Lipitor 10 mg M/W/F and start ASA 81 mg by mouth daily. Patient will come in on 09/23/17 for lab work.  Patient verbalized understanding.

## 2018-06-28 ENCOUNTER — Telehealth: Payer: Self-pay | Admitting: Medical

## 2018-06-28 NOTE — Telephone Encounter (Signed)
See his email message.  Get in for appt ASAP

## 2018-06-29 ENCOUNTER — Encounter: Payer: Self-pay | Admitting: Medical

## 2018-06-29 ENCOUNTER — Ambulatory Visit
Admission: RE | Admit: 2018-06-29 | Discharge: 2018-06-29 | Disposition: A | Discharge status: Home / Self Care | Payer: BLUE CROSS/BLUE SHIELD | Source: Ambulatory Visit | Attending: Medical | Admitting: Medical

## 2018-06-29 ENCOUNTER — Ambulatory Visit: Payer: BLUE CROSS/BLUE SHIELD | Admitting: Medical

## 2018-06-29 VITALS — BP 130/80 | HR 54 | Temp 97.4°F | Resp 16 | Ht 75.0 in | Wt 236.6 lb

## 2018-06-29 DIAGNOSIS — K599 Functional intestinal disorder, unspecified: Secondary | ICD-10-CM

## 2018-06-29 DIAGNOSIS — K6289 Other specified diseases of anus and rectum: Secondary | ICD-10-CM | POA: Diagnosis not present

## 2018-06-29 DIAGNOSIS — G8929 Other chronic pain: Secondary | ICD-10-CM | POA: Diagnosis not present

## 2018-06-29 DIAGNOSIS — M545 Low back pain, unspecified: Secondary | ICD-10-CM

## 2018-06-29 LAB — POCT URINALYSIS DIP (PROADVANTAGE DEVICE)
BILIRUBIN UA: NEGATIVE mg/dL
Bilirubin, UA: NEGATIVE
Glucose, UA: NEGATIVE mg/dL
LEUKOCYTES UA: NEGATIVE
NITRITE UA: NEGATIVE
Protein Ur, POC: NEGATIVE mg/dL
RBC UA: NEGATIVE
Specific Gravity, Urine: 1.015
Urobilinogen, Ur: NEGATIVE
pH, UA: 6.5 (ref 5.0–8.0)

## 2018-06-29 MED ORDER — CYCLOBENZAPRINE HCL 10 MG PO TABS: 10.0000 mg | ORAL_TABLET | Freq: Every day | ORAL | 0 refills | Status: DC

## 2018-06-29 NOTE — Progress Notes (Signed)
Subjective: Chief Complaint  Patient presents with  . back pain    back pain X Sunday    Here for back pain.   Currently he reports feeling fine 4 days ago, but started feeling pain while standing in the shower. No specific injury or fall or trauma.      Sunday first felt the pain.   Mostly midline to right lower back pain, can have sharp pains in waves, but mostly achy.   No pain down leg in general, maybe occasionally this happens.   No numbness or tingling in legs, but Sunday did have some pain radiating into right upper thigh posteriorly.  No problems with urination or bowels.   Sitting on the plane back aggravated his pain.  This weekend was sleeping on air mattress visiting his brother in [place], [place].   Has had intermittent back pain for years.  He reported some back pain back in August after lifting items into his shed.  Had similar issue 3-4 years ago with sciatic pain.  This went away with naproxen.   He did have remote injury several years ago falling out of a car, landing on his side.   Had minor cuts and scrapes.    In 2006 dislocated ankle on right but since then gets weird pains in bottom of distal right foot.   He wants to review his recent cardiology visit and stress test  He also c/o ongoing bowel issues and rectal discomfort at times.   Thinks he has food allergy, possibly to beef or oysters. He has had no additional blood in stool since last visit.   No other aggravating or relieving factors. No other complaint.   Past Medical History:  Diagnosis Date  . Allergic rhinitis   . Asthma    childhood mostly  . Family history of macular degeneration   . Family history of premature CAD   . Headache    occasional  . Hypertension    related to obesity at 290lb  . Vitiligo   . Wears glasses    Past Surgical History:  Procedure Laterality Date  . LACERATION REPAIR      ROS as in subjective  Objective: BP 130/80   Pulse (!) 54   Temp (!) 97.4 F (36.3 C)  (Oral)   Resp 16   Ht 6\' 3"  (1.905 m)   Wt 236 lb 9.6 oz (107.3 kg)   SpO2 99%   BMI 29.57 kg/m    Gen: wd, wn, nad Skin: unremarkable Tender right lumbar region paraspinal, mild pain with flexion and extension which is full, +right SLR at 20 degrees, otherwise nontender, normal hip and leg ROM, normal heel and toe walk, normal LE strength and DTRs and sensation Pulses WNL    Assessment: Encounter Diagnoses  Name Primary?  . Acute low back pain without sciatica, unspecified back pain laterality Yes  . Chronic bilateral low back pain without sciatica   . Rectal discomfort   . Bowel dysfunction      Plan: Back strain, acute back pain but has some chronic back pain as well - go for xray, begin short term flexeril prn, QHS, use OTC Advil 200mg , 4 tablets BID the next 4-5 days, stretch and avoid heavy lifting.  Given the chronic back pain though, go for xray for further eval.  Advised that he was here for acute back pain, and that there wasn't enough time allotted for his other concerns.  He was advised to make another appointment  if he wants to pursue allergy testing.  However if any additional blood in stool, we should refer to GI.  He will let me know.  Derril was seen today for back pain.  Diagnoses and all orders for this visit:  Acute low back pain without sciatica, unspecified back pain laterality -     POCT Urinalysis DIP (Proadvantage Device) -     DG Lumbar Spine Complete; Future  Chronic bilateral low back pain without sciatica  Rectal discomfort  Bowel dysfunction  Other orders -     cyclobenzaprine (FLEXERIL) 10 MG tablet; Take 1 tablet (10 mg total) by mouth at bedtime. prn

## 2018-06-29 NOTE — Telephone Encounter (Signed)
Pt is coming in today

## 2018-09-23 ENCOUNTER — Other Ambulatory Visit: Payer: BLUE CROSS/BLUE SHIELD | Admitting: *Deleted

## 2018-09-23 DIAGNOSIS — R931 Abnormal findings on diagnostic imaging of heart and coronary circulation: Secondary | ICD-10-CM

## 2018-09-23 LAB — HEPATIC FUNCTION PANEL
ALBUMIN: 4.4 g/dL (ref 4.0–5.0)
ALT: 22 IU/L (ref 0–44)
AST: 20 IU/L (ref 0–40)
Alkaline Phosphatase: 56 IU/L (ref 39–117)
Bilirubin Total: 0.2 mg/dL (ref 0.0–1.2)
Bilirubin, Direct: 0.06 mg/dL (ref 0.00–0.40)
TOTAL PROTEIN: 6.9 g/dL (ref 6.0–8.5)

## 2018-09-23 LAB — LIPID PANEL
Chol/HDL Ratio: 2.7 ratio (ref 0.0–5.0)
Cholesterol, Total: 109 mg/dL (ref 100–199)
HDL: 41 mg/dL (ref >39)
LDL CALC: 36 mg/dL (ref 0–99)
Triglycerides: 160 mg/dL — ABNORMAL HIGH (ref 0–149)
VLDL CHOLESTEROL CAL: 32 mg/dL (ref 5–40)

## 2018-12-30 ENCOUNTER — Telehealth: Payer: Self-pay | Admitting: Medical

## 2018-12-30 ENCOUNTER — Other Ambulatory Visit: Payer: Self-pay

## 2018-12-30 DIAGNOSIS — K6289 Other specified diseases of anus and rectum: Secondary | ICD-10-CM

## 2018-12-30 NOTE — Telephone Encounter (Signed)
Done referral put in epic

## 2018-12-30 NOTE — Telephone Encounter (Signed)
Refer to gastroenterology for rectal discomfort, chronic as well as some chronic bowel problems

## 2019-01-03 ENCOUNTER — Other Ambulatory Visit: Payer: Self-pay

## 2019-01-04 ENCOUNTER — Other Ambulatory Visit: Payer: Self-pay

## 2019-01-04 ENCOUNTER — Encounter: Payer: Self-pay | Admitting: Gastroenterology

## 2019-01-04 ENCOUNTER — Ambulatory Visit (INDEPENDENT_AMBULATORY_CARE_PROVIDER_SITE_OTHER): Payer: BLUE CROSS/BLUE SHIELD | Admitting: Gastroenterology

## 2019-01-04 VITALS — Ht 73.0 in | Wt 236.0 lb

## 2019-01-04 DIAGNOSIS — K625 Hemorrhage of anus and rectum: Secondary | ICD-10-CM | POA: Diagnosis not present

## 2019-01-04 DIAGNOSIS — K6289 Other specified diseases of anus and rectum: Secondary | ICD-10-CM

## 2019-01-04 DIAGNOSIS — R194 Change in bowel habit: Secondary | ICD-10-CM | POA: Diagnosis not present

## 2019-01-04 MED ORDER — NA SULFATE-K SULFATE-MG SULF 17.5-3.13-1.6 GM/177ML PO SOLN: 1.0000 | Freq: Once | ORAL | 0 refills | Status: AC

## 2019-01-04 NOTE — Progress Notes (Signed)
This patient contacted our office requesting a physician telemedicine consultation regarding clinical questions and/or test results.  If new patient, they were referred by Chana Bode, PA  Participants on the conference : myself and patient   The patient consented to this consultation and was aware that a charge will be placed through their insurance.  I was in my office and the patient was in his home office   Encounter time:  Total time 30 minutes, with 23 minutes spent with patient on Zoom   _____________________________________________________________________________________________              [facility] Consult Note:  History: Ronnie Bailey 01/04/2019  Referring physician: Carlena Hurl, PA-C  Reason for consult/chief complaint: Rectal pain and bleeding  Subjective  HPI:  Ronnie Bailey was referred by primary care for about 9 months of rectal pain and intermittent bleeding.  Over that time, he has had episodes of rectal pain that typically come on if he has had more than a couple bowel movements in the morning.  He will usually have a formed stool in the morning, then sometimes to loose stools within the next couple of hours.  If that occurs, he may then start to develop a dull aching sensation in the anorectal area which will escalate and at times become very sharp pain that will last for several hours.  There have been several episodes of bleeding over that 38-monthperiod. He has had occasional episodes of a dull brief right lower quadrant bloating or pain before bowel movements.  He denies nausea, vomiting, early satiety, dysphagia, odynophagia or weight loss.  SAnnie Mainbrought this to primary care attention last August, note indicates that a rectal exam was normal.  He was prescribed hydrocortisone cream which has occasionally been helpful for the symptoms.   ROS:  Review of Systems He denies chest pain dyspnea or dysuria Allergies, including to  some shellfish.  He noticed that 1 of these episodes occurred after he ate lobster mac & cheese. Headaches Remainder of systems negative except as above   Past Medical History: Past Medical History:  Diagnosis Date  . Allergic rhinitis   . Asthma    childhood mostly  . Family history of macular degeneration   . Family history of premature CAD   . Headache    occasional  . Hypertension    related to obesity at 290lb  . Vitiligo   . Wears glasses      Past Surgical History: Past Surgical History:  Procedure Laterality Date  . DENTAL SURGERY    . LACERATION REPAIR       Family History: Family History  Problem Relation Age of Onset  . Diabetes Mother   . Hypotension Mother   . Other Mother        macular disease, genetic, somewhat rare  . Heart disease Father 58      MI  . Hypertension Father   . Vitiligo Father   . AAA (abdominal aortic aneurysm) Father   . Hypertension Brother   . Other Brother        eye disease  . Hypertension Brother   . Kidney disease Maternal Grandmother   . Diabetes Maternal Grandfather   . Stomach cancer Maternal Grandfather   . Alzheimer's disease Paternal Grandmother   . Colon cancer Neg Hx   . Esophageal cancer Neg Hx   . Rectal cancer Neg Hx   . Pancreatic cancer Neg Hx     Social History: Social History  Socioeconomic History  . Marital status: Married    Spouse name: Not on file  . Number of children: Not on file  . Years of education: Not on file  . Highest education level: Not on file  Occupational History  . Not on file  Social Needs  . Financial resource strain: Not on file  . Food insecurity:    Worry: Not on file    Inability: Not on file  . Transportation needs:    Medical: Not on file    Non-medical: Not on file  Tobacco Use  . Smoking status: Former Smoker    Packs/day: 1.00    Years: 10.00    Pack years: 10.00    Last attempt to quit: 06/26/2007    Years since quitting: 11.5  . Smokeless tobacco:  Never Used  Substance and Sexual Activity  . Alcohol use: Yes    Alcohol/week: 6.0 standard drinks    Types: 2 Glasses of wine, 2 Cans of beer, 2 Shots of liquor per week  . Drug use: Yes    Types: Marijuana  . Sexual activity: Yes    Partners: Female  Lifestyle  . Physical activity:    Days per week: Not on file    Minutes per session: Not on file  . Stress: Not on file  Relationships  . Social connections:    Talks on phone: Not on file    Gets together: Not on file    Attends religious service: Not on file    Active member of club or organization: Not on file    Attends meetings of clubs or organizations: Not on file    Relationship status: Not on file  Other Topics Concern  . 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  Exercise 2-5 days week 30-90 minutes, stretching, walking, running.  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  Allergies: Allergies  Allergen Reactions  . Oysters [Shellfish Allergy] Diarrhea    Stomach pain and diarrhea    Outpatient Meds: Current Outpatient Medications  Medication Sig Dispense Refill  . aspirin EC 81 MG tablet Take 1 tablet (81 mg total) by mouth daily.    Marland Kitchen atorvastatin (LIPITOR) 10 MG tablet Take 1 tablet (10 mg total) by mouth every Monday, Wednesday, and Friday. 40 tablet 3  . Clocortolone Pivalate (CLODERM) 0.1 % cream Apply 1 application topically 2 (two) times daily.    . hydrocortisone (ANUSOL-HC) 2.5 % rectal cream Place 1 application rectally 2 (two) times daily. 30 g 0   No current facility-administered medications for this visit.       ___________________________________________________________________ Objective    He appears well on the video conference  Exam performed, virtual visit  Labs:  CBC Latest Ref Rng & Units 04/22/2018 05/04/2017 12/27/2014  WBC 3.4 - 10.8 x10E3/uL 6.3 5.0 7.5  Hemoglobin 13.0 -  17.7 g/dL 15.5 14.1 15.5  Hematocrit 37.5 - 51.0 % 43.7 40.3 43.4  Platelets 150 - 450 x10E3/uL 275 203 266   No more recent data   Assessment: Encounter Diagnoses  Name Primary?  . Rectal pain Yes  . Rectal bleeding   . Change in bowel habits     Possible anal fissure, colitis/proctitis, proctalgia fugax with benign anal bleeding.  Plan:  OTC [facility]  My office will contact him to schedule a colonoscopy.  He was agreeable after discussion of procedure and  risks.  He had been thinking for some time that he should probably have a colonoscopy performed because he has been increasingly concerned about the symptoms.  Thank you for the courtesy of this consult.  Please call me with any questions or concerns.  Nelida Meuse III  CC: Referring provider noted above

## 2019-01-04 NOTE — Patient Instructions (Signed)
If you are age 39 or older, your body mass index should be between 23-30. Your Body mass index is 31.14 kg/m. If this is out of the aforementioned range listed, please consider follow up with your Primary Care Provider.  If you are age 36 or younger, your body mass index should be between 19-25. Your Body mass index is 31.14 kg/m. If this is out of the aformentioned range listed, please consider follow up with your Primary Care Provider.   You have been scheduled for a colonoscopy. Please follow written instructions given to you at your visit today.  Please pick up your prep supplies at the pharmacy within the next 1-3 days. If you use inhalers (even only as needed), please bring them with you on the day of your procedure.

## 2019-01-04 NOTE — Addendum Note (Signed)
Addended by: Alexis Frock on: 01/04/2019 10:52 AM   Modules accepted: Orders

## 2019-01-07 ENCOUNTER — Telehealth: Payer: Self-pay | Admitting: *Deleted

## 2019-01-07 NOTE — Telephone Encounter (Signed)
Covid-19 travel screening questions  Have you traveled in the last 14 days?no If yes where?  Do you now or have you had a fever in the last 14 days?no  Do you have any respiratory symptoms of shortness of breath or cough now or in the last 14 days?  Do you have a medical history of Congestive Heart Failure?  Do you have a medical history of lung disease?no  Do you have any family members or close contacts with diagnosed or suspected Covid-19?no  Pt made aware of the [facility] policy and will bring a mask with him if he has one available. Sm

## 2019-01-10 ENCOUNTER — Ambulatory Visit (AMBULATORY_SURGERY_CENTER): Payer: BLUE CROSS/BLUE SHIELD | Admitting: Gastroenterology

## 2019-01-10 ENCOUNTER — Other Ambulatory Visit: Payer: Self-pay

## 2019-01-10 ENCOUNTER — Encounter: Payer: Self-pay | Admitting: Gastroenterology

## 2019-01-10 VITALS — BP 126/84 | HR 60 | Temp 98.6°F | Resp 10 | Ht 73.0 in | Wt 236.0 lb

## 2019-01-10 DIAGNOSIS — K6289 Other specified diseases of anus and rectum: Secondary | ICD-10-CM | POA: Diagnosis present

## 2019-01-10 DIAGNOSIS — R197 Diarrhea, unspecified: Secondary | ICD-10-CM | POA: Diagnosis not present

## 2019-01-10 DIAGNOSIS — K625 Hemorrhage of anus and rectum: Secondary | ICD-10-CM

## 2019-01-10 DIAGNOSIS — R1031 Right lower quadrant pain: Secondary | ICD-10-CM

## 2019-01-10 MED ORDER — SODIUM CHLORIDE 0.9 % IV SOLN
500.0000 mL | Freq: Once | INTRAVENOUS | Status: DC
Start: 2019-01-10 — End: 2019-01-10

## 2019-01-10 MED ORDER — HYOSCYAMINE SULFATE 0.125 MG SL SUBL: 0.1250 mg | SUBLINGUAL_TABLET | Freq: Three times a day (TID) | SUBLINGUAL | 1 refills | Status: DC | PRN

## 2019-01-10 NOTE — Progress Notes (Signed)
Pt.. reports no change in his medical or surgical history since his pre-visit 01/04/2019.

## 2019-01-10 NOTE — Op Note (Signed)
[facility] Patient Name: Ronnie Bailey Procedure Date: 01/10/2019 2:11 PM MRN: [id number] Endoscopist: Sherilyn Cooter L. Myrtie Neither , MD Age: 38 Referring MD:  Date of Birth: 03/16/1981 Gender: Male Account #: [id number] Procedure:                Colonoscopy Indications:              Abdominal pain in the right lower quadrant, Rectal                            bleeding, Rectal pain, diarrhea (all episodic) Medicines:                Monitored Anesthesia Care Procedure:                Pre-Anesthesia Assessment:                           - Prior to the procedure, a History and Physical                            was performed, and patient medications and                            allergies were reviewed. The patient's tolerance of                            previous anesthesia was also reviewed. The risks                            and benefits of the procedure and the sedation                            options and risks were discussed with the patient.                            All questions were answered, and informed consent                            was obtained. Prior Anticoagulants: The patient has                            taken no previous anticoagulant or antiplatelet                            agents. ASA Grade Assessment: II - A patient with                            mild systemic disease. After reviewing the risks                            and benefits, the patient was deemed in                            satisfactory condition to undergo the procedure.  After obtaining informed consent, the colonoscope                            was passed under direct vision. Throughout the                            procedure, the patient's blood pressure, pulse, and                            oxygen saturations were monitored continuously. The                            Colonoscope was introduced through the anus and                            advanced to  the the terminal ileum, with                            identification of the appendiceal orifice and IC                            valve. The colonoscopy was performed without                            difficulty. The patient tolerated the procedure                            well. The quality of the bowel preparation was                            excellent. The terminal ileum, ileocecal valve,                            appendiceal orifice, and rectum were photographed. Scope In: 2:21:52 PM Scope Out: 2:32:15 PM Scope Withdrawal Time: 0 hours 7 minutes 6 seconds  Total Procedure Duration: 0 hours 10 minutes 23 seconds  Findings:                 The perianal and digital rectal examinations were                            normal.                           The terminal ileum appeared normal (to a depth of                            approximately 10cm).                           The entire examined colon appeared normal on direct                            and retroflexion views. Complications:            No immediate complications.  Estimated Blood Loss:     Estimated blood loss: none. Impression:               - The examined portion of the ileum was normal.                           - The entire examined colon is normal on direct and                            retroflexion views.                           - No specimens collected.                           IBS with episodic benign anal bleeding Recommendation:           - Patient has a contact number available for                            emergencies. The signs and symptoms of potential                            delayed complications were discussed with the                            patient. Return to normal activities tomorrow.                            Written discharge instructions were provided to the                            patient.                           - Resume previous diet.                           - Continue  present medications.                           - Repeat colonoscopy at age 38 for screening                            purposes.                           - Use Levsin, NuLev (hyoscyamine) 0.125 mg 1-2 tabs                            SL q 8 hours as needed for rectal pain/spasm.                            Disp#30 tabs, RF 1. [name], MD 01/10/2019 2:41:43 PM This report has been signed electronically.

## 2019-01-10 NOTE — Patient Instructions (Signed)
YOU HAD AN ENDOSCOPIC PROCEDURE TODAY AT THE [facility]:   Refer to the procedure report that was given to you for any specific questions about what was found during the examination.  If the procedure report does not answer your questions, please call your gastroenterologist to clarify.  If you requested that your care partner not be given the details of your procedure findings, then the procedure report has been included in a sealed envelope for you to review at your convenience later.  YOU SHOULD EXPECT: Some feelings of bloating in the abdomen. Passage of more gas than usual.  Walking can help get rid of the air that was put into your GI tract during the procedure and reduce the bloating. If you had a lower endoscopy (such as a colonoscopy or flexible sigmoidoscopy) you may notice spotting of blood in your stool or on the toilet paper. If you underwent a bowel prep for your procedure, you may not have a normal bowel movement for a few days.  Please Note:  You might notice some irritation and congestion in your nose or some drainage.  This is from the oxygen used during your procedure.  There is no need for concern and it should clear up in a day or so.  SYMPTOMS TO REPORT IMMEDIATELY:   Following lower endoscopy (colonoscopy or flexible sigmoidoscopy):  Excessive amounts of blood in the stool  Significant tenderness or worsening of abdominal pains  Swelling of the abdomen that is new, acute  Fever of 100F or higher  For urgent or emergent issues, a gastroenterologist can be reached at any hour by calling [phone].   DIET:  We do recommend a small meal at first, but then you may proceed to your regular diet.  Drink plenty of fluids but you should avoid alcoholic beverages for 24 hours.  ACTIVITY:  You should plan to take it easy for the rest of today and you should NOT DRIVE or use heavy machinery until tomorrow (because of the sedation medicines used during the test).     FOLLOW UP: Our staff will call the number listed on your records 48-72 hours following your procedure to check on you and address any questions or concerns that you may have regarding the information given to you following your procedure. If we do not reach you, we will leave a message.  We will attempt to reach you two times.  During this call, we will ask if you have developed any symptoms of COVID 19. If you develop any symptoms (for example fever, flu-like symptoms, shortness of breath, cough etc.) before then, please call [phone].  Take your new medication as ordered.  It is in your pharmacy.   SIGNATURES/CONFIDENTIALITY: You and/or your care partner have signed paperwork which will be entered into your electronic medical record.  These signatures attest to the fact that that the information above on your After Visit Summary has been reviewed and is understood.  Full responsibility of the confidentiality of this discharge information lies with you and/or your care-partner.

## 2019-01-10 NOTE — Progress Notes (Signed)
Report given to PACU, vss 

## 2019-01-12 ENCOUNTER — Telehealth: Payer: Self-pay | Admitting: *Deleted

## 2019-01-12 NOTE — Telephone Encounter (Signed)
  Follow up Call-  Call back number 01/10/2019  Post procedure Call Back phone  # [phone]  Permission to leave phone message Yes  Some recent data might be hidden     Patient questions:  Do you have a fever, pain , or abdominal swelling? No. Pain Score  0 *  Have you tolerated food without any problems? Yes.    Have you been able to return to your normal activities? Yes.    Do you have any questions about your discharge instructions: Diet   No. Medications  No. Follow up visit  No.  Do you have questions or concerns about your Care? No.  Actions: * If pain score is 4 or above: No action needed, pain <4.   1. Have you developed a fever since your procedure? no  2.   Have you had an respiratory symptoms (SOB or cough) since your procedure? no  3.   Have you tested positive for COVID 19 since your procedure no  4.   Have you had any family members/close contacts diagnosed with the COVID 19 since your procedure?  no   If any of these questions are a yes, please inquire if patient has been seen by family doctor and route this note to Laverna Peace, Charity fundraiser.
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
## 2019-01-21 ENCOUNTER — Telehealth: Payer: Self-pay | Admitting: Medical

## 2019-01-21 NOTE — Telephone Encounter (Signed)
Left message on voicemail.

## 2019-01-21 NOTE — Telephone Encounter (Signed)
I saw where he had his colonoscopy.   Glad to see no worrisome findings.

## 2019-04-06 ENCOUNTER — Encounter: Payer: Self-pay | Admitting: Family Medicine

## 2019-04-06 ENCOUNTER — Other Ambulatory Visit: Payer: Self-pay

## 2019-04-06 ENCOUNTER — Ambulatory Visit: Payer: BC Managed Care – PPO | Admitting: Family Medicine

## 2019-04-06 VITALS — BP 140/98 | HR 71 | Temp 98.0°F | Wt 241.8 lb

## 2019-04-06 DIAGNOSIS — R030 Elevated blood-pressure reading, without diagnosis of hypertension: Secondary | ICD-10-CM

## 2019-04-06 NOTE — Progress Notes (Signed)
   Subjective:    Patient ID: Ronnie Bailey, male    DOB: Dec 09, 1980, 38 y.o.   MRN: [id number]  HPI He is here for consult concerning recent blood pressure elevation.  He was seen by his dentist earlier today and diastolics were in the low 100s.  Review of his record indicates no previous elevated blood pressures of any significance.   Review of Systems     Objective:   Physical Exam Alert and in no distress.  Blood pressure is recorded.       Assessment & Plan:   Encounter Diagnosis  Name Primary?  . Elevated blood pressure reading Yes  I discussed elevated blood pressure with him.  He is is now involved in an exercise program and I encouraged him to continue to do that and also avoid decongestants as well as excessive caffeine consumption which he does not do.  He is to get a blood pressure cuff and come back here in 2 months for recheck.  He was comfortable with that.

## 2019-05-18 ENCOUNTER — Telehealth: Payer: Self-pay | Admitting: Medical

## 2019-05-18 ENCOUNTER — Ambulatory Visit: Payer: BC Managed Care – PPO | Admitting: Medical

## 2019-05-18 ENCOUNTER — Encounter: Payer: Self-pay | Admitting: Medical

## 2019-05-18 ENCOUNTER — Other Ambulatory Visit: Payer: Self-pay

## 2019-05-18 VITALS — Ht 75.0 in | Wt 238.0 lb

## 2019-05-18 DIAGNOSIS — R030 Elevated blood-pressure reading, without diagnosis of hypertension: Secondary | ICD-10-CM | POA: Diagnosis not present

## 2019-05-18 DIAGNOSIS — H00011 Hordeolum externum right upper eyelid: Secondary | ICD-10-CM | POA: Diagnosis not present

## 2019-05-18 MED ORDER — ERYTHROMYCIN 5 MG/GM OP OINT: 1.0000 "application " | TOPICAL_OINTMENT | Freq: Every day | OPHTHALMIC | 0 refills | Status: DC

## 2019-05-18 NOTE — Telephone Encounter (Signed)
Called and left pt a VM.

## 2019-05-18 NOTE — Telephone Encounter (Signed)
He sent email message about stye.   I looked at the pic.   OTC remedies include warm compresses with warm cloth.   His eyelid was red though suggesting possible infection.   Please make him a virtual consult so we can document and prescribe appropriate treatment
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
## 2019-05-30 ENCOUNTER — Other Ambulatory Visit: Payer: Self-pay

## 2019-05-30 DIAGNOSIS — Z20822 Contact with and (suspected) exposure to covid-19: Secondary | ICD-10-CM

## 2019-06-01 LAB — NOVEL CORONAVIRUS, NAA: SARS-CoV-2, NAA: NOT DETECTED

## 2019-06-06 ENCOUNTER — Ambulatory Visit: Payer: BC Managed Care – PPO | Admitting: Medical

## 2019-06-06 ENCOUNTER — Other Ambulatory Visit: Payer: Self-pay

## 2019-06-06 ENCOUNTER — Encounter: Payer: Self-pay | Admitting: Medical

## 2019-06-06 VITALS — BP 130/85 | HR 95 | Temp 97.7°F | Ht 75.0 in | Wt 241.4 lb

## 2019-06-06 DIAGNOSIS — E669 Obesity, unspecified: Secondary | ICD-10-CM | POA: Diagnosis not present

## 2019-06-06 DIAGNOSIS — R9431 Abnormal electrocardiogram [ECG] [EKG]: Secondary | ICD-10-CM | POA: Diagnosis not present

## 2019-06-06 DIAGNOSIS — Z0000 Encounter for general adult medical examination without abnormal findings: Secondary | ICD-10-CM | POA: Diagnosis not present

## 2019-06-06 DIAGNOSIS — M545 Low back pain: Secondary | ICD-10-CM

## 2019-06-06 DIAGNOSIS — K594 Anal spasm: Secondary | ICD-10-CM

## 2019-06-06 DIAGNOSIS — R030 Elevated blood-pressure reading, without diagnosis of hypertension: Secondary | ICD-10-CM | POA: Insufficient documentation

## 2019-06-06 DIAGNOSIS — G8929 Other chronic pain: Secondary | ICD-10-CM

## 2019-06-06 DIAGNOSIS — K6289 Other specified diseases of anus and rectum: Secondary | ICD-10-CM

## 2019-06-06 DIAGNOSIS — R079 Chest pain, unspecified: Secondary | ICD-10-CM

## 2019-06-06 DIAGNOSIS — Z83518 Family history of other specified eye disorder: Secondary | ICD-10-CM

## 2019-06-06 DIAGNOSIS — Z8249 Family history of ischemic heart disease and other diseases of the circulatory system: Secondary | ICD-10-CM

## 2019-06-06 DIAGNOSIS — Z7185 Encounter for immunization safety counseling: Secondary | ICD-10-CM

## 2019-06-06 DIAGNOSIS — Z7189 Other specified counseling: Secondary | ICD-10-CM

## 2019-06-06 DIAGNOSIS — K599 Functional intestinal disorder, unspecified: Secondary | ICD-10-CM

## 2019-06-06 MED ORDER — HYDROCORTISONE 2.5 % RE CREA: 1.0000 "application " | TOPICAL_CREAM | Freq: Two times a day (BID) | RECTAL | 0 refills | Status: DC

## 2019-06-06 NOTE — Progress Notes (Signed)
Subjective:   HPI  Ronnie Bailey is a 38 y.o. male who presents for Chief Complaint  Patient presents with  . Annual Exam    Patient Care Team: Tysinger, Kermit Balo, PA-C as PCP - General (Family Medicine) Wendall Stade, MD as PCP - Cardiology (Cardiology) Sees dentist Sees eye doctor  Concerns: Here for physical.  We had a recent virtual consult however he discussed elevated blood pressure readings at the dentist office and at home but recent readings he sent me through my chart mostly been normal or borderline.  He denies chest pain, shortness of breath, edema, palpitations.  He does get some chest discomfort periodically but also gets acid reflux symptoms.  He has ongoing rectal pain.  Worse in the mornings, better by the afternoon or into the day.  Worse with eating junk food.  He notes that he ate a bunch of crap this weekend, has sugar and high fat foods and certainly have worsening of this we can.  He notes many days can have numerous greater than 2 bowel movements per day.  This seems to worsen his rectal pain.   Reviewed their medical, surgical, family, social, medication, and allergy history and updated chart as appropriate.  Past Medical History:  Diagnosis Date  . Allergic rhinitis   . Asthma    childhood mostly  . Family history of macular degeneration   . Family history of premature CAD   . Headache    occasional  . Hypertension    related to obesity at 290lb  . Vitiligo   . Wears glasses     Past Surgical History:  Procedure Laterality Date  . DENTAL SURGERY    . LACERATION REPAIR      Social History   Socioeconomic History  . Marital status: Married    Spouse name: Not on file  . Number of children: Not on file  . Years of education: Not on file  . Highest education level: Not on file  Occupational History  . Not on file  Social Needs  . Financial resource strain: Not on file  . Food insecurity    Worry: Not on file    Inability: Not on  file  . Transportation needs    Medical: Not on file    Non-medical: Not on file  Tobacco Use  . Smoking status: Former Smoker    Packs/day: 1.00    Years: 10.00    Pack years: 10.00    Quit date: 06/26/2007    Years since quitting: 11.9  . Smokeless tobacco: Never Used  Substance and Sexual Activity  . Alcohol use: Yes    Alcohol/week: 5.0 standard drinks    Types: 1 Glasses of wine, 2 Cans of beer, 2 Shots of liquor per week  . Drug use: Yes    Types: Marijuana  . Sexual activity: Yes    Partners: Female  Lifestyle  . Physical activity    Days per week: Not on file    Minutes per session: Not on file  . Stress: Not on file  Relationships  . Social Musician on phone: Not on file    Gets together: Not on file    Attends religious service: Not on file    Active member of club or organization: Not on file    Attends meetings of clubs or organizations: Not on file    Relationship status: Not on file  . Intimate partner violence    Fear of  current or ex partner: Not on file    Emotionally abused: Not on file    Physically abused: Not on file    Forced sexual activity: Not on file  Other Topics Concern  . 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  Exercise 2-5 days week 30-90 minutes, stretching, walking, running.  05/2019    Family History  Problem Relation Age of Onset  . Diabetes Mother   . Hypotension Mother   . Other Mother        macular disease, genetic, somewhat rare  . Heart disease Mother        valve disease  . Arthritis Mother   . Heart disease Father 11       MI  . Hypertension Father   . Vitiligo Father   . AAA (abdominal aortic aneurysm) Father   . Hypertension Brother   . Other Brother        eye disease  . Hypertension Brother   . Cancer Brother        skin  . Kidney disease Maternal Grandmother   . Diabetes Maternal Grandfather   . Stomach cancer Maternal  Grandfather   . Alzheimer's disease Paternal Grandmother   . Colon cancer Neg Hx   . Esophageal cancer Neg Hx   . Rectal cancer Neg Hx   . Pancreatic cancer Neg Hx      Current Outpatient Medications:  .  aspirin EC 81 MG tablet, Take 1 tablet (81 mg total) by mouth daily., Disp: , Rfl:  .  atorvastatin (LIPITOR) 10 MG tablet, Take 1 tablet (10 mg total) by mouth every Monday, Wednesday, and Friday., Disp: 40 tablet, Rfl: 3 .  chlorhexidine (PERIDEX) 0.12 % solution, , Disp: , Rfl:  .  Clocortolone Pivalate (CLODERM) 0.1 % cream, Apply 1 application topically 2 (two) times daily., Disp: , Rfl:  .  erythromycin ophthalmic ointment, Place 1 application into the right eye at bedtime., Disp: 3.5 g, Rfl: 0 .  hydrocortisone (ANUSOL-HC) 2.5 % rectal cream, Place 1 application rectally 2 (two) times daily., Disp: 30 g, Rfl: 0 .  hyoscyamine (LEVSIN SL) 0.125 MG SL tablet, Place 1 tablet (0.125 mg total) under the tongue 3 (three) times daily as needed. (Patient not taking: Reported on 05/18/2019), Disp: 30 tablet, Rfl: 1  Allergies  Allergen Reactions  . Oysters [Shellfish Allergy] Diarrhea    Stomach pain and diarrhea       Review of Systems Constitutional: -fever, -chills, -sweats, -unexpected weight change, -decreased appetite, -fatigue Allergy: -sneezing, -itching, -congestion Dermatology: -changing moles, --rash, -lumps ENT: -runny nose, -ear pain, -sore throat, -hoarseness, -sinus pain, -teeth pain, - ringing in ears, -hearing loss, -nosebleeds Cardiology: -chest pain, -palpitations, -swelling, -difficulty breathing when lying flat, -waking up short of breath Respiratory: -cough, -shortness of breath, -difficulty breathing with exercise or exertion, -wheezing, -coughing up blood Gastroenterology: -abdominal pain, -nausea, -vomiting, -diarrhea, -constipation, -blood in stool, -changes in bowel movement, -difficulty swallowing or eating Hematology: -bleeding, -bruising   Musculoskeletal: -joint aches, -muscle aches, -joint swelling, -back pain, -neck pain, -cramping, -changes in gait Ophthalmology: denies vision changes, eye redness, itching, discharge Urology: -burning with urination, -difficulty urinating, -blood in urine, -urinary frequency, -urgency, -incontinence Neurology: -headache, -weakness, -tingling, -numbness, -memory loss, -falls, -dizziness Psychology: -depressed mood, -agitation, -sleep problems Male GU: no testicular mass, pain, no lymph nodes swollen, no swelling, no rash.     Objective:  BP 130/85  Pulse 95   Temp 97.7 F (36.5 C)   Ht  (1.905 m)   Wt 241 lb 6.4 oz (109.5 kg)   SpO2 97%   BMI 30.17 kg/m   General appearance: alert, no distress, WD/WN, Caucasian male Skin: He had a patches of vitiligo throughout body, no worrisome lesions HEENT: normocephalic, conjunctiva/corneas normal, sclerae anicteric, PERRLA, EOMi, nares patent, no discharge or erythema, pharynx normal Oral cavity: MMM, tongue normal, teeth normal Neck: supple, no lymphadenopathy, no thyromegaly, no masses, normal ROM, no bruits Chest: non tender, normal shape and expansion Heart: RRR, normal S1, S2, no murmurs Lungs: CTA bilaterally, no wheezes, rhonchi, or rales Abdomen: +bs, soft, non tender, non distended, no masses, no hepatomegaly, no splenomegaly, no bruits Back: non tender, normal ROM, no scoliosis Musculoskeletal: upper extremities non tender, no obvious deformity, normal ROM throughout, lower extremities non tender, no obvious deformity, normal ROM throughout Extremities: no edema, no cyanosis, no clubbing Pulses: 2+ symmetric, upper and lower extremities, normal cap refill Neurological: alert, oriented x 3, CN2-12 intact, strength normal upper extremities and lower extremities, sensation normal throughout, DTRs 2+ throughout, no cerebellar signs, gait normal Psychiatric: normal affect, behavior normal, pleasant  GU: normal male external  genitalia, nontender, no masses, no hernia, no lymphadenopathy Rectal: Posterior anus is a small area of fullness suggestive of possibly early hemorrhoid, otherwise no lesions, no obvious fissure, there is whitish hypopigmentation around the perimeter of anus consistent with vitiligo   Assessment and Plan :   Encounter Diagnoses  Name Primary?  . Routine general medical examination at a health care facility Yes  . Obesity without serious comorbidity, unspecified classification, unspecified obesity type   . Abnormal EKG   . Elevated blood-pressure reading without diagnosis of hypertension   . Family history of premature coronary artery disease   . Family history of macular degeneration   . Rectal spasm   . Bowel dysfunction   . Chest pain, unspecified type   . Vaccine counseling   . Rectal discomfort   . Chronic bilateral low back pain without sciatica     Physical exam - discussed and counseled on healthy lifestyle, diet, exercise, preventative care, vaccinations, sick and well care, proper use of emergency dept and after hours care, and addressed their concerns.    Health screening: See your eye doctor yearly for routine vision care. See your dentist yearly for routine dental care including hygiene visits twice yearly.  Cancer screening Advised monthly self testicular exam  Labs today, nonfasting.  He will plan fasting lipid at upcoming cardiology appt.   Vaccinations: He is up to date on recent flu shot, Td, and pneumococcal vaccines  Separate significant  issues discussed: Elevated blood pressure-advise he continue to monitor readings.  Recent readings he sent to me from home measurements have mostly been normal to borderline but a few are high.  He is supposed to see cardiology soon for yearly follow-up.  We will give him a bit longer to take readings and present to cardiology.  We discussed things he can do with healthy diet, salt limitations, exercise and weight loss to  help with blood pressure  Obesity-continue efforts to lose weight through healthy diet and exercise  Abnormal EKG-evaluated by cardiology this past year.  Due for yearly follow-up now  We discussed his symptoms of bowel dysfunction, rectal discomfort, history of rectal spasm-this could be related to eating junk food, could be related to food intolerance or food allergy.  If symptoms persist, then follow-up  with gastroenterology.  In the meantime can use hydrocortisone cream around the anal area as needed but not daily.  Discussed risk of long-term steroid cream use.  We discussed sitz bath and hot soapy baths.  Advised to limit junk food, high sugar foods, fried foods, lots of spicy foods and limit grain portion size in general.  Consider food and symptom diary.   Viviann SpareSteven was seen today for annual exam.  Diagnoses and all orders for this visit:  Routine general medical examination at a health care facility -     Comprehensive metabolic panel -     CBC with Differential/Platelet  Obesity without serious comorbidity, unspecified classification, unspecified obesity type  Abnormal EKG  Elevated blood-pressure reading without diagnosis of hypertension  Family history of premature coronary artery disease  Family history of macular degeneration  Rectal spasm  Bowel dysfunction  Chest pain, unspecified type  Vaccine counseling  Rectal discomfort  Chronic bilateral low back pain without sciatica    Follow-up pending labs, yearly for physical

## 2019-06-06 NOTE — Telephone Encounter (Signed)
[facility] is requesting to fill pt hydrocortisone cream. Please advise [facility]

## 2019-06-07 ENCOUNTER — Telehealth: Payer: Self-pay

## 2019-06-07 LAB — COMPREHENSIVE METABOLIC PANEL
ALT: 21 IU/L (ref 0–44)
AST: 21 IU/L (ref 0–40)
Albumin/Globulin Ratio: 1.9 (ref 1.2–2.2)
Albumin: 4.7 g/dL (ref 4.0–5.0)
Alkaline Phosphatase: 46 IU/L (ref 39–117)
BUN/Creatinine Ratio: 14 (ref 9–20)
BUN: 17 mg/dL (ref 6–20)
Bilirubin Total: 0.4 mg/dL (ref 0.0–1.2)
CO2: 22 mmol/L (ref 20–29)
Calcium: 9.4 mg/dL (ref 8.7–10.2)
Chloride: 108 mmol/L — ABNORMAL HIGH (ref 96–106)
Creatinine, Ser: 1.23 mg/dL (ref 0.76–1.27)
GFR calc Af Amer: 85 mL/min/{1.73_m2} (ref >59)
GFR calc non Af Amer: 73 mL/min/{1.73_m2} (ref >59)
Globulin, Total: 2.5 g/dL (ref 1.5–4.5)
Glucose: 80 mg/dL (ref 65–99)
Potassium: 4.8 mmol/L (ref 3.5–5.2)
Sodium: 144 mmol/L (ref 134–144)
Total Protein: 7.2 g/dL (ref 6.0–8.5)

## 2019-06-07 LAB — CBC WITH DIFFERENTIAL/PLATELET
Basophils Absolute: 0 10*3/uL (ref 0.0–0.2)
Basos: 0 %
EOS (ABSOLUTE): 0.1 10*3/uL (ref 0.0–0.4)
Eos: 1 %
Hematocrit: 44.3 % (ref 37.5–51.0)
Hemoglobin: 14.7 g/dL (ref 13.0–17.7)
Immature Grans (Abs): 0 10*3/uL (ref 0.0–0.1)
Immature Granulocytes: 0 %
Lymphocytes Absolute: 1.8 10*3/uL (ref 0.7–3.1)
Lymphs: 26 %
MCH: 29.1 pg (ref 26.6–33.0)
MCHC: 33.2 g/dL (ref 31.5–35.7)
MCV: 88 fL (ref 79–97)
Monocytes Absolute: 0.3 10*3/uL (ref 0.1–0.9)
Monocytes: 5 %
Neutrophils Absolute: 4.7 10*3/uL (ref 1.4–7.0)
Neutrophils: 68 %
Platelets: 234 10*3/uL (ref 150–450)
RBC: 5.05 x10E6/uL (ref 4.14–5.80)
RDW: 12.9 % (ref 11.6–15.4)
WBC: 7 10*3/uL (ref 3.4–10.8)

## 2019-06-07 NOTE — Telephone Encounter (Signed)
Left message for patient to call back  

## 2019-06-07 NOTE — Telephone Encounter (Signed)
-----   Message from Josue Hector, MD sent at 06/06/2019 10:47 PM EDT ----- See if you can get him in to see laura ingold or lori gerhardt for BP ----- Message ----- From: Carlena Hurl, PA-C Sent: 06/06/2019  10:01 PM EDT To: Josue Hector, MD  Hello Dr. Johnsie Cancel, please get him in for yearly f/u with you soon regarding elevated BPs, fasting lipids

## 2019-06-27 NOTE — Telephone Encounter (Signed)
Patient called back and talked to a scheduler and made an appointment with Dr. Johnsie Cancel.

## 2019-07-08 NOTE — Progress Notes (Signed)
  Cardiology Office Note   Date:  07/13/2019   ID:  Ronnie Bailey, DOB 09/12/1980, MRN [id number]  PCP:  Tysinger, David S, PA-C  Cardiologist:   Peter Nishan, MD   No chief complaint on file.     History of Present Illness: Ronnie Bailey is a 38 y.o. male who presents for f/u regarding chest pain abnormal ECG and family history of CAD.  SSCP random not exertional dull and left sided Does walk daily without issues Also random pains in arms and legs Former smoker and HTN related to obesity Father had MI at age 51 ECG from 04/22/18 reviewed and showed SR with LAFB with LVH.    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 Allergic rhinitis   . Asthma    childhood mostly  . Family history of macular degeneration   . Family history of premature CAD   . Headache    occasional  . Hypertension    related to obesity at 290lb  . Vitiligo   . Wears glasses     Past Surgical History:  Procedure Laterality Date  . DENTAL SURGERY    . LACERATION REPAIR       Current Outpatient Medications  Medication Sig Dispense Refill  . aspirin EC 81 MG tablet Take 1 tablet (81 mg total) by mouth daily.    . atorvastatin (LIPITOR) 10 MG tablet Take 1 tablet (10 mg total) by mouth every Monday, Wednesday, and Friday. 40 tablet 3  . chlorhexidine (PERIDEX) 0.12 % solution     . Clocortolone Pivalate (CLODERM) 0.1 % cream Apply 1 application topically 2 (two) times daily.    . erythromycin ophthalmic ointment Place 1 application into the right eye at bedtime. 3.5 g 0  . hydrocortisone (ANUSOL-HC) 2.5 % rectal cream Place 1  application rectally 2 (two) times daily. 30 g 0  . hyoscyamine (LEVSIN SL) 0.125 MG SL tablet Place 1 tablet (0.125 mg total) under the tongue 3 (three) times daily as needed. 30 tablet 1  . losartan (COZAAR) 25 MG tablet Take 1 tablet (25 mg total) by mouth daily. 90 tablet 3   No current facility-administered medications for this visit.     Allergies:   Oysters [shellfish allergy]    Social History:  The patient  reports that he quit smoking about 12 years ago. He has a 10.00 pack-year smoking history. He has never used smokeless tobacco. He reports current alcohol use of about 5.0 standard drinks of alcohol per week. He reports current drug use. Drug: Marijuana.   Family History:  The patient'Bailey family history includes AAA (abdominal aortic aneurysm) in his father; Alzheimer'Bailey disease in his paternal grandmother; Arthritis in his mother; Cancer in his brother; Diabetes in his maternal grandfather and mother; Heart disease in his mother; Heart disease (age of onset: 51) in his father; Hypertension in his brother, brother, and father; Hypotension in his mother; Kidney disease in his maternal grandmother; Other in his brother and mother; Stomach cancer in his maternal grandfather; Vitiligo in his father.    ROS:  Please see   the history of present illness.   Otherwise, review of systems are positive for none.   All other systems are reviewed and negative.    PHYSICAL EXAM: VS:  BP 132/88   Pulse 67   Ht 6' 3" (1.905 m)   Wt 245 lb (111.1 kg)   SpO2 100%   BMI 30.62 kg/m  , BMI Body mass index is 30.62 kg/m. Affect appropriate Healthy:  appears stated age HEENT: normal Neck supple with no adenopathy JVP normal no bruits no thyromegaly Lungs clear with no wheezing and good diaphragmatic motion Heart:  S1/S2 no murmur, no rub, gallop or click PMI normal Abdomen: benighn, BS positve, no tenderness, no AAA no bruit.  No HSM or HJR Distal pulses intact with no bruits No edema Neuro  non-focal Skin warm and dry No muscular weakness    EKG:  07/13/19 SR rate 67 LAD otherwise normal    Recent Labs: 06/06/2019: ALT 21; BUN 17; Creatinine, Ser 1.23; Hemoglobin 14.7; Platelets 234; Potassium 4.8; Sodium 144    Lipid Panel    Component Value Date/Time   CHOL 109 09/23/2018 0823   TRIG 160 (H) 09/23/2018 0823   HDL 41 09/23/2018 0823   CHOLHDL 2.7 09/23/2018 0823   CHOLHDL 3.8 12/27/2014 0001   VLDL 18 12/27/2014 0001   LDLCALC 36 09/23/2018 0823      Wt Readings from Last 3 Encounters:  07/13/19 245 lb (111.1 kg)  06/06/19 241 lb 6.4 oz (109.5 kg)  05/18/19 238 lb (108 kg)      Other studies Reviewed: Additional studies/ records that were reviewed today include: Notes primary ECG labs .    ASSESSMENT AND PLAN:  1.  HTN:  Start Cozaar 25 mg BMET with lipid labs in 3 weeks discussed moderation in ETOH 2. Abnormal ECG :  LAFB LVH suggest yearly ECG  3. Family history premature CAD LDL at goal normal ETT 06/22/18 High calcium score for age  Labs in 3 weeks    Current medicines are reviewed at length with the patient today.  The patient does not have concerns regarding medicines.  The following changes have been made:  Cozaar 25 mg daily   Labs/ tests ordered today include: CMP and lipids in 3 weeks   Orders Placed This Encounter  Procedures  . Comp Met (CMET)  . Lipid Profile  . EKG 12-Lead     Disposition:   FU with cardiology in a year      Signed, Peter Nishan, MD  07/13/2019 10:53 AM    [facility] [address], [place], [place]  [address] Phone: [phone]; Fax: [phone]  

## 2019-07-13 ENCOUNTER — Ambulatory Visit (INDEPENDENT_AMBULATORY_CARE_PROVIDER_SITE_OTHER): Payer: BC Managed Care – PPO | Admitting: Cardiovascular Disease

## 2019-07-13 ENCOUNTER — Encounter: Payer: Self-pay | Admitting: Cardiovascular Disease

## 2019-07-13 ENCOUNTER — Other Ambulatory Visit: Payer: Self-pay

## 2019-07-13 VITALS — BP 132/88 | HR 67 | Ht 75.0 in | Wt 245.0 lb

## 2019-07-13 DIAGNOSIS — E785 Hyperlipidemia, unspecified: Secondary | ICD-10-CM

## 2019-07-13 DIAGNOSIS — I10 Essential (primary) hypertension: Secondary | ICD-10-CM | POA: Diagnosis not present

## 2019-07-13 MED ORDER — LOSARTAN POTASSIUM 25 MG PO TABS: 25.0000 mg | ORAL_TABLET | Freq: Every day | ORAL | 3 refills | Status: DC

## 2019-07-13 NOTE — Patient Instructions (Addendum)
Medication Instructions:  Your physician has recommended you make the following change in your medication:  1-START losartan 25 mg by mouth daily  *If you need a refill on your cardiac medications before your next appointment, please call your pharmacy*  Lab Work: Your physician recommends that you return for lab work in: 3 weeks for CMET and Lipid panel  If you have labs (blood work) drawn today and your tests are completely normal, you will receive your results only by: Marland Kitchen MyChart Message (if you have MyChart) OR . A paper copy in the mail If you have any lab test that is abnormal or we need to change your treatment, we will call you to review the results.  Testing/Procedures: None ordered today.  Follow-Up: At [facility], you and your health needs are our priority.  As part of our continuing mission to provide you with exceptional heart care, we have created designated Provider Care Teams.  These Care Teams include your primary Cardiologist (physician) and Advanced Practice Providers (APPs -  Physician Assistants and Nurse Practitioners) who all work together to provide you with the care you need, when you need it.  Your next appointment:   12 months  The format for your next appointment:   In Person  Provider:   You may see Jenkins Rouge, MD or one of the following Advanced Practice Providers on your designated Care Team:    Truitt Merle, NP  Cecilie Kicks, NP  Kathyrn Drown, NP

## 2019-07-20 ENCOUNTER — Other Ambulatory Visit: Payer: Self-pay | Admitting: Cardiovascular Disease

## 2019-07-20 MED ORDER — ATORVASTATIN CALCIUM 10 MG PO TABS: 10.0000 mg | ORAL_TABLET | ORAL | 3 refills | Status: DC

## 2019-08-03 ENCOUNTER — Other Ambulatory Visit: Payer: Self-pay

## 2019-08-05 ENCOUNTER — Other Ambulatory Visit: Payer: Self-pay

## 2019-08-05 ENCOUNTER — Other Ambulatory Visit: Payer: BC Managed Care – PPO | Admitting: *Deleted

## 2019-08-05 DIAGNOSIS — E785 Hyperlipidemia, unspecified: Secondary | ICD-10-CM

## 2019-08-05 DIAGNOSIS — I10 Essential (primary) hypertension: Secondary | ICD-10-CM

## 2019-08-05 LAB — LIPID PANEL
Chol/HDL Ratio: 2.5 ratio (ref 0.0–5.0)
Cholesterol, Total: 106 mg/dL (ref 100–199)
HDL: 42 mg/dL (ref >39)
LDL Chol Calc (NIH): 48 mg/dL (ref 0–99)
Triglycerides: 78 mg/dL (ref 0–149)
VLDL Cholesterol Cal: 16 mg/dL (ref 5–40)

## 2019-08-05 LAB — COMPREHENSIVE METABOLIC PANEL
ALT: 20 IU/L (ref 0–44)
AST: 20 IU/L (ref 0–40)
Albumin/Globulin Ratio: 2 (ref 1.2–2.2)
Albumin: 4.5 g/dL (ref 4.0–5.0)
Alkaline Phosphatase: 43 IU/L (ref 39–117)
BUN/Creatinine Ratio: 11 (ref 9–20)
BUN: 12 mg/dL (ref 6–20)
Bilirubin Total: 0.6 mg/dL (ref 0.0–1.2)
CO2: 24 mmol/L (ref 20–29)
Calcium: 9.4 mg/dL (ref 8.7–10.2)
Chloride: 103 mmol/L (ref 96–106)
Creatinine, Ser: 1.08 mg/dL (ref 0.76–1.27)
GFR calc Af Amer: 99 mL/min/{1.73_m2} (ref >59)
GFR calc non Af Amer: 86 mL/min/{1.73_m2} (ref >59)
Globulin, Total: 2.3 g/dL (ref 1.5–4.5)
Glucose: 83 mg/dL (ref 65–99)
Potassium: 4.4 mmol/L (ref 3.5–5.2)
Sodium: 139 mmol/L (ref 134–144)
Total Protein: 6.8 g/dL (ref 6.0–8.5)

## 2019-09-30 ENCOUNTER — Ambulatory Visit: Payer: BC Managed Care – PPO | Attending: Internal Medicine

## 2019-09-30 DIAGNOSIS — Z20822 Contact with and (suspected) exposure to covid-19: Secondary | ICD-10-CM

## 2019-10-01 LAB — NOVEL CORONAVIRUS, NAA: SARS-CoV-2, NAA: NOT DETECTED

## 2019-10-17 ENCOUNTER — Other Ambulatory Visit: Payer: Self-pay | Admitting: Cardiovascular Disease

## 2019-11-14 MED ORDER — ATORVASTATIN CALCIUM 10 MG PO TABS: 10.0000 mg | ORAL_TABLET | ORAL | 7 refills | Status: DC

## 2020-06-06 ENCOUNTER — Encounter: Payer: BC Managed Care – PPO | Admitting: Medical

## 2020-07-10 ENCOUNTER — Ambulatory Visit: Payer: BC Managed Care – PPO | Admitting: Medical

## 2020-07-10 ENCOUNTER — Encounter: Payer: Self-pay | Admitting: Medical

## 2020-07-10 ENCOUNTER — Other Ambulatory Visit: Payer: Self-pay

## 2020-07-10 VITALS — BP 140/90 | HR 79 | Ht 75.0 in | Wt 258.8 lb

## 2020-07-10 DIAGNOSIS — Z0000 Encounter for general adult medical examination without abnormal findings: Secondary | ICD-10-CM | POA: Insufficient documentation

## 2020-07-10 DIAGNOSIS — E785 Hyperlipidemia, unspecified: Secondary | ICD-10-CM | POA: Insufficient documentation

## 2020-07-10 DIAGNOSIS — Z23 Encounter for immunization: Secondary | ICD-10-CM | POA: Diagnosis not present

## 2020-07-10 DIAGNOSIS — Z1159 Encounter for screening for other viral diseases: Secondary | ICD-10-CM | POA: Insufficient documentation

## 2020-07-10 DIAGNOSIS — Z83518 Family history of other specified eye disorder: Secondary | ICD-10-CM

## 2020-07-10 DIAGNOSIS — Z8249 Family history of ischemic heart disease and other diseases of the circulatory system: Secondary | ICD-10-CM

## 2020-07-10 DIAGNOSIS — Z3009 Encounter for other general counseling and advice on contraception: Secondary | ICD-10-CM | POA: Insufficient documentation

## 2020-07-10 DIAGNOSIS — G8929 Other chronic pain: Secondary | ICD-10-CM

## 2020-07-10 DIAGNOSIS — M545 Low back pain, unspecified: Secondary | ICD-10-CM

## 2020-07-10 DIAGNOSIS — Z7185 Encounter for immunization safety counseling: Secondary | ICD-10-CM

## 2020-07-10 DIAGNOSIS — Z6832 Body mass index (BMI) 32.0-32.9, adult: Secondary | ICD-10-CM | POA: Diagnosis not present

## 2020-07-10 DIAGNOSIS — I10 Essential (primary) hypertension: Secondary | ICD-10-CM

## 2020-07-10 LAB — LIPID PANEL

## 2020-07-10 NOTE — Progress Notes (Signed)
Subjective:   HPI  Ronnie Bailey is a 39 y.o. male who presents for Chief Complaint  Patient presents with  . Annual Exam    not fasting-  . Sterilization    discussion before referral     Patient Care Team: Cris Talavera, Kermit Balo, PA-C as PCP - General (Family Medicine) Wendall Stade, MD as PCP - Cardiology (Cardiology)  Ginger Organ, [place] with [facility] dermatology Sees dentist Sees eye doctor  Concerns: Here for physical.  Hypertension-compliant with losartan but diastolic routinely around 90.  No chest pain, no dyspnea no palpitations no edema  He would like referral to urology for vasectomy.  He and wife have discussed and he is ready to take this step.  She has been on birth control for years.  History of rectal pain and bowel dysfunction-Enough veg and water helps Carbs and alcohol worse symptoms, prior GI consult, only has had episodes 2 times this summer compared to daily in the past   Reviewed their medical, surgical, family, social, medication, and allergy history and updated chart as appropriate.  Past Medical History:  Diagnosis Date  . Allergic rhinitis   . Asthma    childhood mostly  . Family history of macular degeneration   . Family history of premature CAD   . Headache    occasional  . Hyperlipidemia   . Hypertension    related to obesity at 290lb  . Vitiligo   . Wears glasses     Past Surgical History:  Procedure Laterality Date  . DENTAL SURGERY    . LACERATION REPAIR      Social History   Socioeconomic History  . Marital status: Married    Spouse name: Not on file  . Number of children: Not on file  . Years of education: Not on file  . Highest education level: Not on file  Occupational History  . Not on file  Tobacco Use  . Smoking status: Former Smoker    Packs/day: 1.00    Years: 10.00    Pack years: 10.00    Quit date: 06/26/2007    Years since quitting: 13.0  . Smokeless tobacco: Never Used  Vaping Use  . Vaping Use: Never  used  Substance and Sexual Activity  . Alcohol use: Yes    Alcohol/week: 5.0 standard drinks    Types: 1 Glasses of wine, 2 Cans of beer, 2 Shots of liquor per week  . Drug use: Yes    Types: Marijuana  . Sexual activity: Yes    Partners: Female  Other Topics Concern  . Not on file  Social History Narrative   Married, has some cats, works for [employer].  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  Exercise with cardio and doing calisthenics, pushups with friend.  06/2020   Social Determinants of Health   Financial Resource Strain:   . Difficulty of Paying Living Expenses: Not on file  Food Insecurity:   . Worried About Programme researcher, broadcasting/film/video in the Last Year: Not on file  . Ran Out of Food in the Last Year: Not on file  Transportation Needs:   . Lack of Transportation (Medical): Not on file  . Lack of Transportation (Non-Medical): Not on file  Physical Activity:   . Days of Exercise per Week: Not on file  . Minutes of Exercise per Session: Not on file  Stress:   . Feeling of Stress : Not on file  Social Connections:   . Frequency  of Communication with Friends and Family: Not on file  . Frequency of Social Gatherings with Friends and Family: Not on file  . Attends Religious Services: Not on file  . Active Member of Clubs or Organizations: Not on file  . Attends BankerClub or Organization Meetings: Not on file  . Marital Status: Not on file  Intimate Partner Violence:   . Fear of Current or Ex-Partner: Not on file  . Emotionally Abused: Not on file  . Physically Abused: Not on file  . Sexually Abused: Not on file    Family History  Problem Relation Age of Onset  . Diabetes Mother   . Hypotension Mother   . Other Mother        macular disease, genetic, somewhat rare  . Heart disease Mother        valve disease  . Arthritis Mother   . Heart disease Father [id number]       MI  . Hypertension Father   . Vitiligo Father   . AAA (abdominal aortic aneurysm)  Father   . Hypertension Brother   . Other Brother        eye disease  . Hypertension Brother   . Cancer Brother        skin  . Kidney disease Maternal Grandmother   . Diabetes Maternal Grandfather   . Stomach cancer Maternal Grandfather   . Cancer Maternal Grandfather        stomach  . Alzheimer's disease Paternal Grandmother   . Colon cancer Neg Hx   . Esophageal cancer Neg Hx   . Rectal cancer Neg Hx   . Pancreatic cancer Neg Hx      Current Outpatient Medications:  .  aspirin EC 81 MG tablet, Take 1 tablet (81 mg total) by mouth daily., Disp: , Rfl:  .  atorvastatin (LIPITOR) 10 MG tablet, Take 1 tablet (10 mg total) by mouth every Monday, Wednesday, and Friday., Disp: 15 tablet, Rfl: 7 .  losartan (COZAAR) 25 MG tablet, Take 1 tablet (25 mg total) by mouth daily., Disp: 90 tablet, Rfl: 3 .  chlorhexidine (PERIDEX) 0.12 % solution, , Disp: , Rfl:  .  Clocortolone Pivalate (CLODERM) 0.1 % cream, Apply 1 application topically 2 (two) times daily. (Patient not taking: Reported on 07/10/2020), Disp: , Rfl:  .  hydrocortisone (ANUSOL-HC) 2.5 % rectal cream, Place 1 application rectally 2 (two) times daily. (Patient not taking: Reported on 07/10/2020), Disp: 30 g, Rfl: 0 .  hyoscyamine (LEVSIN SL) 0.125 MG SL tablet, Place 1 tablet (0.125 mg total) under the tongue 3 (three) times daily as needed. (Patient not taking: Reported on 07/10/2020), Disp: 30 tablet, Rfl: 1  Allergies  Allergen Reactions  . Oysters [Shellfish Allergy] Diarrhea    Stomach pain and diarrhea     Review of Systems Constitutional: -fever, -chills, -sweats, -unexpected weight change, -decreased appetite, -fatigue Allergy: -sneezing, -itching, -congestion Dermatology: -changing moles, --rash, -lumps ENT: -runny nose, -ear pain, -sore throat, -hoarseness, -sinus pain, -teeth pain, - ringing in ears, -hearing loss, -nosebleeds Cardiology: -chest pain, -palpitations, -swelling, -difficulty breathing when lying  flat, -waking up short of breath Respiratory: -cough, -shortness of breath, -difficulty breathing with exercise or exertion, -wheezing, -coughing up blood Gastroenterology: -abdominal pain, -nausea, -vomiting, -diarrhea, -constipation, -blood in stool, -changes in bowel movement, -difficulty swallowing or eating Hematology: -bleeding, -bruising  Musculoskeletal: -joint aches, -muscle aches, -joint swelling, -back pain, -neck pain, -cramping, -changes in gait Ophthalmology: denies vision changes, eye redness, itching, discharge Urology: -burning  with urination, -difficulty urinating, -blood in urine, -urinary frequency, -urgency, -incontinence Neurology: -headache, -weakness, -tingling, -numbness, -memory loss, -falls, -dizziness Psychology: -depressed mood, -agitation, -sleep problems Male GU: no testicular mass, pain, no lymph nodes swollen, no swelling, no rash.     Objective:  BP 140/90   Pulse 79   Ht 6\' 3"  (1.905 m)   Wt 258 lb 12.8 oz (117.4 kg)   SpO2 98%   BMI 32.35 kg/m   General appearance: alert, no distress, WD/WN, Caucasian male Skin: He had a patches of vitiligo throughout body, no worrisome lesions HEENT: normocephalic, conjunctiva/corneas normal, sclerae anicteric, PERRLA, EOMi, nares patent, no discharge or erythema, pharynx normal Oral cavity: MMM, tongue normal, teeth normal Neck: supple, no lymphadenopathy, no thyromegaly, no masses, normal ROM, no bruits Chest: non tender, normal shape and expansion Heart: RRR, normal S1, S2, no murmurs Lungs: CTA bilaterally, no wheezes, rhonchi, or rales Abdomen: +bs, soft, non tender, non distended, no masses, no hepatomegaly, no splenomegaly, no bruits Back: non tender, normal ROM, no scoliosis Musculoskeletal: upper extremities non tender, no obvious deformity, normal ROM throughout, lower extremities non tender, no obvious deformity, normal ROM throughout Extremities: no edema, no cyanosis, no clubbing Pulses: 2+ symmetric,  upper and lower extremities, normal cap refill Neurological: alert, oriented x 3, CN2-12 intact, strength normal upper extremities and lower extremities, sensation normal throughout, DTRs 2+ throughout, no cerebellar signs, gait normal Psychiatric: normal affect, behavior normal, pleasant  GU: normal male external genitalia,circ, nontender, no masses, no hernia, no lymphadenopathy Rectal: declined   Assessment and Plan :   Encounter Diagnoses  Name Primary?  . Encounter for health maintenance examination in adult Yes  . Need for vaccination   . Vaccine counseling   . BMI 32.0-32.9,adult   . Family history of premature coronary artery disease   . Family history of macular degeneration   . Chronic bilateral low back pain without sciatica   . Essential hypertension, benign   . Hyperlipidemia, unspecified hyperlipidemia type   . Encounter for hepatitis C screening test for low risk patient   . Vasectomy evaluation     Physical exam - discussed and counseled on healthy lifestyle, diet, exercise, preventative care, vaccinations, sick and well care, proper use of emergency dept and after hours care, and addressed their concerns.    Health screening: See your eye doctor yearly for routine vision care. See your dentist yearly for routine dental care including hygiene visits twice yearly.  Cancer screening Advised monthly self testicular exam  advised colon and prostate cancer screening age 18yo   Vaccinations: He is up to date on recent flu shot, Td, and pneumococcal vaccines  Counseled on the Moderna Covid virus vaccine.  Vaccine information sheet given.  Moderna Covid vaccine given after consent obtained.   Separate significant  issues discussed: Hypertension-pending labs we will likely increase losartan to a higher dose  Hyperlipidemia-continue statin  Obesity-continue efforts to lose weight through healthy diet and exercise  Routine labs today  Referral to urology for  consult on vasectomy   Eric was seen today for annual exam and sterilization.  Diagnoses and all orders for this visit:  Encounter for health maintenance examination in adult -     Comprehensive metabolic panel -     CBC -     Lipid panel -     HIV Antibody (routine testing w rflx) -     Hepatitis C antibody  Need for vaccination -     Moderna SARS-CoV-2 Vaccine  Vaccine counseling -     Moderna SARS-CoV-2 Vaccine  BMI 32.0-32.9,adult  Family history of premature coronary artery disease  Family history of macular degeneration  Chronic bilateral low back pain without sciatica  Essential hypertension, benign -     Comprehensive metabolic panel  Hyperlipidemia, unspecified hyperlipidemia type -     Lipid panel  Encounter for hepatitis C screening test for low risk patient -     Hepatitis C antibody  Vasectomy evaluation -     Ambulatory referral to Urology    Follow-up pending labs, yearly for physical

## 2020-07-11 LAB — CBC
Hematocrit: 44.3 % (ref 37.5–51.0)
Hemoglobin: 15.1 g/dL (ref 13.0–17.7)
MCH: 29.6 pg (ref 26.6–33.0)
MCHC: 34.1 g/dL (ref 31.5–35.7)
MCV: 87 fL (ref 79–97)
Platelets: 252 10*3/uL (ref 150–450)
RBC: 5.1 x10E6/uL (ref 4.14–5.80)
RDW: 12.7 % (ref 11.6–15.4)
WBC: 6.3 10*3/uL (ref 3.4–10.8)

## 2020-07-11 LAB — COMPREHENSIVE METABOLIC PANEL
ALT: 28 IU/L (ref 0–44)
AST: 27 IU/L (ref 0–40)
Albumin/Globulin Ratio: 1.9 (ref 1.2–2.2)
Albumin: 4.8 g/dL (ref 4.0–5.0)
Alkaline Phosphatase: 46 IU/L (ref 44–121)
BUN/Creatinine Ratio: 10 (ref 9–20)
BUN: 10 mg/dL (ref 6–24)
Bilirubin Total: 0.6 mg/dL (ref 0.0–1.2)
CO2: 25 mmol/L (ref 20–29)
Calcium: 9.4 mg/dL (ref 8.7–10.2)
Chloride: 106 mmol/L (ref 96–106)
Creatinine, Ser: 1.01 mg/dL (ref 0.76–1.27)
GFR calc Af Amer: 107 mL/min/{1.73_m2} (ref >59)
GFR calc non Af Amer: 93 mL/min/{1.73_m2} (ref >59)
Globulin, Total: 2.5 g/dL (ref 1.5–4.5)
Glucose: 91 mg/dL (ref 65–99)
Potassium: 4.5 mmol/L (ref 3.5–5.2)
Sodium: 141 mmol/L (ref 134–144)
Total Protein: 7.3 g/dL (ref 6.0–8.5)

## 2020-07-11 LAB — LIPID PANEL
Chol/HDL Ratio: 3.2 ratio (ref 0.0–5.0)
Cholesterol, Total: 136 mg/dL (ref 100–199)
HDL: 42 mg/dL (ref >39)
LDL Chol Calc (NIH): 70 mg/dL (ref 0–99)
Triglycerides: 137 mg/dL (ref 0–149)
VLDL Cholesterol Cal: 24 mg/dL (ref 5–40)

## 2020-07-11 LAB — HEPATITIS C ANTIBODY: Hep C Virus Ab: <0.1 s/co ratio (ref 0.0–0.9)

## 2020-07-11 LAB — HIV ANTIBODY (ROUTINE TESTING W REFLEX): HIV Screen 4th Generation wRfx: NONREACTIVE

## 2020-07-13 ENCOUNTER — Other Ambulatory Visit: Payer: Self-pay | Admitting: Medical

## 2020-07-13 MED ORDER — LOSARTAN POTASSIUM 50 MG PO TABS: 50.0000 mg | ORAL_TABLET | Freq: Every day | ORAL | 1 refills | Status: DC

## 2020-07-13 MED ORDER — ATORVASTATIN CALCIUM 10 MG PO TABS: 10.0000 mg | ORAL_TABLET | ORAL | 11 refills | Status: DC

## 2020-08-15 ENCOUNTER — Other Ambulatory Visit: Payer: Self-pay

## 2020-08-15 ENCOUNTER — Ambulatory Visit: Payer: BC Managed Care – PPO | Admitting: Medical

## 2020-08-15 ENCOUNTER — Encounter: Payer: Self-pay | Admitting: Medical

## 2020-08-15 VITALS — BP 138/88 | HR 77 | Ht 75.0 in | Wt 263.4 lb

## 2020-08-15 DIAGNOSIS — E669 Obesity, unspecified: Secondary | ICD-10-CM | POA: Diagnosis not present

## 2020-08-15 DIAGNOSIS — Z8249 Family history of ischemic heart disease and other diseases of the circulatory system: Secondary | ICD-10-CM | POA: Diagnosis not present

## 2020-08-15 DIAGNOSIS — I10 Essential (primary) hypertension: Secondary | ICD-10-CM | POA: Diagnosis not present

## 2020-08-15 MED ORDER — LOSARTAN POTASSIUM-HCTZ 50-12.5 MG PO TABS: 1.0000 | ORAL_TABLET | Freq: Every day | ORAL | 3 refills | Status: DC

## 2020-08-15 NOTE — Progress Notes (Signed)
Subjective:  Ronnie Bailey is a 39 y.o. male who presents for Chief Complaint  Patient presents with  . Follow-up    Following up on blood pressure and medications.      Here for follow-up.  I saw him in November for a physical.  Blood pressure was not at goal.  We increased his losartan to 50 mg daily.  He is tolerating this well.  He is exercising.  Trying to eat healthy.   He has been checking home blood pressures.  Home readings in the 120-130, DBP still in the mid 80s.   No side effects of medication.   No prior diuretic.  No other aggravating or relieving factors.    No other c/o.  Past Medical History:  Diagnosis Date  . Allergic rhinitis   . Asthma    childhood mostly  . Family history of macular degeneration   . Family history of premature CAD   . Headache    occasional  . Hyperlipidemia   . Hypertension    related to obesity at 290lb  . Vitiligo   . Wears glasses     The following portions of the patient's history were reviewed and updated as appropriate: allergies, current medications, past family history, past medical history, past social history, past surgical history and problem list.  ROS Otherwise as in subjective above  Objective: BP 138/88   Pulse 77   Ht 6\' 3"  (1.905 m)   Wt 263 lb 6.4 oz (119.5 kg)   SpO2 98%   BMI 32.92 kg/m   BP Readings from Last 3 Encounters:  08/15/20 138/88  07/10/20 140/90  07/13/19 132/88   Wt Readings from Last 3 Encounters:  08/15/20 263 lb 6.4 oz (119.5 kg)  07/10/20 258 lb 12.8 oz (117.4 kg)  07/13/19 245 lb (111.1 kg)    General appearance: alert, no distress, well developed, well nourished    Assessment: Encounter Diagnoses  Name Primary?  . Essential hypertension, benign Yes  . Obesity without serious comorbidity, unspecified classification, unspecified obesity type   . Family history of premature coronary artery disease      Plan: Hypertension - change to Losartan HCT 50/12.5 mg daily.   Limit  salt.  Work on efforts to lose weight.  Continue current medication and healthy lifestyle  Obesity-continue efforts to lose weight through healthy diet and exercise  Family history of premature CAD -continue statin   Jaymin was seen today for follow-up.  Diagnoses and all orders for this visit:  Essential hypertension, benign -     Basic metabolic panel; Future  Obesity without serious comorbidity, unspecified classification, unspecified obesity type  Family history of premature coronary artery disease  Other orders -     losartan-hydrochlorothiazide (HYZAAR) 50-12.5 MG tablet; Take 1 tablet by mouth daily.    Follow up: Yearly for physical, 4-6 weeks for nurse visit for lab

## 2020-09-12 ENCOUNTER — Other Ambulatory Visit: Payer: Self-pay

## 2020-09-12 ENCOUNTER — Other Ambulatory Visit: Payer: No Typology Code available for payment source

## 2020-09-12 DIAGNOSIS — I10 Essential (primary) hypertension: Secondary | ICD-10-CM

## 2020-09-12 LAB — BASIC METABOLIC PANEL
BUN/Creatinine Ratio: 10 (ref 9–20)
BUN: 11 mg/dL (ref 6–24)
CO2: 24 mmol/L (ref 20–29)
Calcium: 9.7 mg/dL (ref 8.7–10.2)
Chloride: 103 mmol/L (ref 96–106)
Creatinine, Ser: 1.06 mg/dL (ref 0.76–1.27)
GFR calc Af Amer: 101 mL/min/{1.73_m2} (ref >59)
GFR calc non Af Amer: 87 mL/min/{1.73_m2} (ref >59)
Glucose: 91 mg/dL (ref 65–99)
Potassium: 4.6 mmol/L (ref 3.5–5.2)
Sodium: 142 mmol/L (ref 134–144)

## 2020-10-23 HISTORY — PX: VASECTOMY: SHX75

## 2021-07-20 ENCOUNTER — Other Ambulatory Visit: Payer: Self-pay | Admitting: Medical

## 2021-08-05 ENCOUNTER — Other Ambulatory Visit: Payer: Self-pay

## 2021-08-05 ENCOUNTER — Other Ambulatory Visit: Payer: Self-pay | Admitting: Medical

## 2021-08-05 MED ORDER — ATORVASTATIN CALCIUM 10 MG PO TABS: 10.0000 mg | ORAL_TABLET | ORAL | 0 refills | Status: DC

## 2021-08-30 ENCOUNTER — Encounter: Payer: No Typology Code available for payment source | Admitting: Medical

## 2021-09-13 ENCOUNTER — Encounter: Payer: Self-pay | Admitting: Medical

## 2021-09-13 ENCOUNTER — Ambulatory Visit: Payer: No Typology Code available for payment source | Admitting: Medical

## 2021-09-13 ENCOUNTER — Other Ambulatory Visit: Payer: Self-pay

## 2021-09-13 VITALS — BP 130/88 | HR 74 | Ht 75.0 in | Wt 255.0 lb

## 2021-09-13 DIAGNOSIS — E785 Hyperlipidemia, unspecified: Secondary | ICD-10-CM | POA: Diagnosis not present

## 2021-09-13 DIAGNOSIS — Z83518 Family history of other specified eye disorder: Secondary | ICD-10-CM

## 2021-09-13 DIAGNOSIS — I10 Essential (primary) hypertension: Secondary | ICD-10-CM | POA: Diagnosis not present

## 2021-09-13 DIAGNOSIS — Z8249 Family history of ischemic heart disease and other diseases of the circulatory system: Secondary | ICD-10-CM

## 2021-09-13 DIAGNOSIS — Z0000 Encounter for general adult medical examination without abnormal findings: Secondary | ICD-10-CM | POA: Diagnosis not present

## 2021-09-13 DIAGNOSIS — Z131 Encounter for screening for diabetes mellitus: Secondary | ICD-10-CM

## 2021-09-13 NOTE — Progress Notes (Signed)
Subjective:   HPI  Ronnie Bailey is a 41 y.o. male who presents for Chief Complaint  Patient presents with   fasting cpe    Fasitng cpe, sometime shoulder on right side goes number. No other concerns    Patient Care Team: Runa Whittingham, Camelia Eng, PA-C as PCP - General (Family Medicine) Josue Hector, MD as PCP - Cardiology (Cardiology)  Magnus Sinning, [place] with [facility] dermatology Sees dentist Sees eye doctor Dr. Wilfrid Lund, GI  Concerns: Only recent concern is isolated numbness of right shoulder blade, intermittently.  No arm or shoulder pain, no particular back pain.  Does get neck pain on right occasionally.  He may have had injury on jacobs ladder exercise equipment at gym 6-8 months ago.    Never had BP issues prior to covid infection in past.  Hypertension-compliant with medication but diastolic routinely 80- 90.  No chest pain, no dyspnea no palpitations no edema  Hyperlipdiema - taking statin 3 days per week  Reviewed their medical, surgical, family, social, medication, and allergy history and updated chart as appropriate.  Past Medical History:  Diagnosis Date   Allergic rhinitis    Asthma    childhood mostly   Family history of macular degeneration    Family history of premature CAD    Headache    occasional   Hyperlipidemia    Hypertension    related to obesity at 290lb   Vitiligo    Wears glasses     Past Surgical History:  Procedure Laterality Date   DENTAL SURGERY     LACERATION REPAIR     VASECTOMY  10/2020    Social History   Socioeconomic History   Marital status: Married    Spouse name: Not on file   Number of children: Not on file   Years of education: Not on file   Highest education level: Not on file  Occupational History   Not on file  Tobacco Use   Smoking status: Former    Packs/day: 1.00    Years: 10.00    Pack years: 10.00    Types: Cigarettes    Quit date: 06/26/2007    Years since quitting: 14.2   Smokeless tobacco: Never   Vaping Use   Vaping Use: Never used  Substance and Sexual Activity   Alcohol use: Yes    Alcohol/week: 5.0 standard drinks    Types: 1 Glasses of wine, 2 Cans of beer, 2 Shots of liquor per week   Drug use: Yes    Types: Marijuana   Sexual activity: Yes    Partners: Female  Other Topics Concern   Not on file  Social History Narrative   Married, no children, has some cats, working in Scientist, research (physical sciences).  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  Exercise with cardio and doing calisthenics, pushups with friend.   08/2021   Social Determinants of Health   Financial Resource Strain: Not on file  Food Insecurity: Not on file  Transportation Needs: Not on file  Physical Activity: Not on file  Stress: Not on file  Social Connections: Not on file  Intimate Partner Violence: Not on file    Family History  Problem Relation Age of Onset   Diabetes Mother    Hypotension Mother    Other Mother        macular disease, genetic, somewhat rare   Heart disease Mother        valve disease   Arthritis Mother  Heart disease Father 42       MI   Hypertension Father    Vitiligo Father    AAA (abdominal aortic aneurysm) Father    Hypertension Brother    Other Brother        eye disease   Hypertension Brother    Cancer Brother        skin   Kidney disease Maternal Grandmother    Diabetes Maternal Grandfather    Stomach cancer Maternal Grandfather    Cancer Maternal Grandfather        stomach   Alzheimer's disease Paternal Grandmother    Colon cancer Neg Hx    Esophageal cancer Neg Hx    Rectal cancer Neg Hx    Pancreatic cancer Neg Hx      Current Outpatient Medications:    aspirin EC 81 MG tablet, Take 1 tablet (81 mg total) by mouth daily., Disp: , Rfl:    atorvastatin (LIPITOR) 10 MG tablet, TAKE 1 TABLET BY MOUTH MONDAY, WEDNESDAY AND FRIDAY, Disp: 30 tablet, Rfl: 0   losartan-hydrochlorothiazide (HYZAAR) 50-12.5 MG tablet, Take 1 tablet by mouth daily., Disp: 90  tablet, Rfl: 3  Allergies  Allergen Reactions   Oysters [Shellfish Allergy] Diarrhea    Stomach pain and diarrhea     Review of Systems Constitutional: -fever, -chills, -sweats, -unexpected weight change, -decreased appetite, -fatigue Allergy: -sneezing, -itching, -congestion Dermatology: -changing moles, --rash, -lumps ENT: -runny nose, -ear pain, -sore throat, -hoarseness, -sinus pain, -teeth pain, - ringing in ears, -hearing loss, -nosebleeds Cardiology: -chest pain, -palpitations, -swelling, -difficulty breathing when lying flat, -waking up short of breath Respiratory: -cough, -shortness of breath, -difficulty breathing with exercise or exertion, -wheezing, -coughing up blood Gastroenterology: -abdominal pain, -nausea, -vomiting, -diarrhea, -constipation, -blood in stool, -changes in bowel movement, -difficulty swallowing or eating Hematology: -bleeding, -bruising  Musculoskeletal: -joint aches, -muscle aches, -joint swelling, -back pain, -neck pain, -cramping, -changes in gait Ophthalmology: denies vision changes, eye redness, itching, discharge Urology: -burning with urination, -difficulty urinating, -blood in urine, -urinary frequency, -urgency, -incontinence Neurology: -headache, -weakness, -tingling, +numbness, -memory loss, -falls, -dizziness Psychology: -depressed mood, -agitation, -sleep problems Male GU: no testicular mass, pain, no lymph nodes swollen, no swelling, no rash.       Objective:  BP 130/88    Pulse 74    Ht 6\' 3"  (1.905 m)    Wt 255 lb (115.7 kg)    BMI 31.87 kg/m   General appearance: alert, no distress, WD/WN, Caucasian male Skin: He has patches of vitiligo throughout body, arms, GU, around hips, no worrisome lesions HEENT: normocephalic, conjunctiva/corneas normal, sclerae anicteric, PERRLA, EOMi Neck: supple, no lymphadenopathy, no thyromegaly, no masses, normal ROM, no bruits Chest: non tender, normal shape and expansion Heart: RRR, normal S1, S2,  no murmurs Lungs: CTA bilaterally, no wheezes, rhonchi, or rales Abdomen: +bs, soft, non tender, non distended, no masses, no hepatomegaly, no splenomegaly, no bruits Back: non tender, normal ROM, no scoliosis Musculoskeletal: shoulders exam unremarkable, upper extremities non tender, no obvious deformity, normal ROM throughout, lower extremities non tender, no obvious deformity, normal ROM throughout Extremities: no edema, no cyanosis, no clubbing Pulses: 2+ symmetric, upper and lower extremities, normal cap refill Neurological: alert, oriented x 3, CN2-12 intact, strength normal upper extremities and lower extremities, sensation normal throughout, DTRs 2+ throughout, no cerebellar signs, gait normal Psychiatric: normal affect, behavior normal, pleasant  GU: normal male external genitalia,circ, nontender, no masses, no hernia, no lymphadenopathy Rectal: declined   EKG Indication, hypertension, screening,  history of abnormal EKG, rate 58 bpm, PR 184 ms, QRS 116 ms, QTC 422 ms, axis -46 degrees, sinus bradycardia, left anterior fascicular block.  Compared to prior EKG some widening of QRS   Assessment and Plan :   Encounter Diagnoses  Name Primary?   Routine general medical examination at a health care facility Yes   Hyperlipidemia, unspecified hyperlipidemia type    Family history of premature coronary artery disease    Family history of macular degeneration    Essential hypertension, benign    Screening for diabetes mellitus     Physical exam - discussed and counseled on healthy lifestyle, diet, exercise, preventative care, vaccinations, sick and well care, proper use of emergency dept and after hours care, and addressed their concerns.    Health screening: See your eye doctor yearly for routine vision care. See your dentist yearly for routine dental care including hygiene visits twice yearly.  Cancer screening Advised monthly self testicular exam  advised prostate cancer  screening age 78yo  Reviewed 2020 colonoscopy report.  Vaccinations: Immunization History  Administered Date(s) Administered   Influenza Inj Mdck Quad Pf 07/02/2018   Influenza-Unspecified 07/02/2018, 06/03/2019, 06/19/2020, 05/16/2021   Moderna Sars-Covid-2 Vaccination 07/10/2020   PFIZER(Purple Top)SARS-COV-2 Vaccination 11/04/2019, 11/30/2019   Pfizer Covid-19 Vaccine Bivalent Booster 41yrs & up 05/16/2021   Pneumococcal Polysaccharide-23 12/27/2014   Tdap 04/22/2018     Separate significant  issues discussed: Hypertension-pending labs we will likely change to valsartan HCT  Hyperlipidemia-continue statin, routine labs today  Obesity-continue efforts to lose weight through healthy diet and exercise  Routine labs today  S/p vasectomy  Shoulder numbness - discussed possible cause.  Possibly prior brachial plexus injury from 6-8 months ago on gym equipment.  Consider physical therapy referral   Maxymilian was seen today for fasting cpe.  Diagnoses and all orders for this visit:  Routine general medical examination at a health care facility -     Lipid panel -     Comprehensive metabolic panel -     CBC -     EKG 12-Lead -     Hemoglobin A1c  Hyperlipidemia, unspecified hyperlipidemia type -     Lipid panel  Family history of premature coronary artery disease -     EKG 12-Lead  Family history of macular degeneration  Essential hypertension, benign -     EKG 12-Lead  Screening for diabetes mellitus -     Hemoglobin A1c   Follow-up pending labs, yearly for physical

## 2021-09-14 ENCOUNTER — Other Ambulatory Visit: Payer: Self-pay | Admitting: Medical

## 2021-09-14 LAB — COMPREHENSIVE METABOLIC PANEL
ALT: 29 IU/L (ref 0–44)
AST: 31 IU/L (ref 0–40)
Albumin/Globulin Ratio: 2 (ref 1.2–2.2)
Albumin: 5.2 g/dL — ABNORMAL HIGH (ref 4.0–5.0)
Alkaline Phosphatase: 46 IU/L (ref 44–121)
BUN/Creatinine Ratio: 12 (ref 9–20)
BUN: 15 mg/dL (ref 6–24)
Bilirubin Total: 0.7 mg/dL (ref 0.0–1.2)
CO2: 25 mmol/L (ref 20–29)
Calcium: 9.7 mg/dL (ref 8.7–10.2)
Chloride: 100 mmol/L (ref 96–106)
Creatinine, Ser: 1.24 mg/dL (ref 0.76–1.27)
Globulin, Total: 2.6 g/dL (ref 1.5–4.5)
Glucose: 87 mg/dL (ref 70–99)
Potassium: 4.9 mmol/L (ref 3.5–5.2)
Sodium: 138 mmol/L (ref 134–144)
Total Protein: 7.8 g/dL (ref 6.0–8.5)
eGFR: 75 mL/min/{1.73_m2} (ref >59)

## 2021-09-14 LAB — CBC
Hematocrit: 46.9 % (ref 37.5–51.0)
Hemoglobin: 16.4 g/dL (ref 13.0–17.7)
MCH: 29.4 pg (ref 26.6–33.0)
MCHC: 35 g/dL (ref 31.5–35.7)
MCV: 84 fL (ref 79–97)
Platelets: 304 10*3/uL (ref 150–450)
RBC: 5.58 x10E6/uL (ref 4.14–5.80)
RDW: 12.9 % (ref 11.6–15.4)
WBC: 7.7 10*3/uL (ref 3.4–10.8)

## 2021-09-14 LAB — LIPID PANEL
Chol/HDL Ratio: 3.1 ratio (ref 0.0–5.0)
Cholesterol, Total: 134 mg/dL (ref 100–199)
HDL: 43 mg/dL (ref >39)
LDL Chol Calc (NIH): 74 mg/dL (ref 0–99)
Triglycerides: 90 mg/dL (ref 0–149)
VLDL Cholesterol Cal: 17 mg/dL (ref 5–40)

## 2021-09-14 LAB — HEMOGLOBIN A1C
Est. average glucose Bld gHb Est-mCnc: 105 mg/dL
Hgb A1c MFr Bld: 5.3 % (ref 4.8–5.6)

## 2021-09-14 MED ORDER — VALSARTAN-HYDROCHLOROTHIAZIDE 160-12.5 MG PO TABS: 1.0000 | ORAL_TABLET | Freq: Every day | ORAL | 3 refills | Status: DC

## 2021-09-14 MED ORDER — ASPIRIN EC 81 MG PO TBEC: 81.0000 mg | DELAYED_RELEASE_TABLET | Freq: Every day | ORAL | 3 refills | Status: DC

## 2021-09-14 MED ORDER — ATORVASTATIN CALCIUM 10 MG PO TABS: 10.0000 mg | ORAL_TABLET | ORAL | 2 refills | Status: DC

## 2021-09-16 ENCOUNTER — Other Ambulatory Visit: Payer: Self-pay | Admitting: Internal Medicine

## 2021-09-16 DIAGNOSIS — M25519 Pain in unspecified shoulder: Secondary | ICD-10-CM

## 2021-09-16 DIAGNOSIS — R200 Anesthesia of skin: Secondary | ICD-10-CM

## 2021-12-11 ENCOUNTER — Ambulatory Visit: Payer: No Typology Code available for payment source | Admitting: Medical

## 2021-12-11 VITALS — BP 120/74 | HR 72 | Wt 265.8 lb

## 2021-12-11 DIAGNOSIS — Z7184 Encounter for health counseling related to travel: Secondary | ICD-10-CM | POA: Diagnosis not present

## 2021-12-11 DIAGNOSIS — Z298 Encounter for other specified prophylactic measures: Secondary | ICD-10-CM

## 2021-12-11 DIAGNOSIS — Z139 Encounter for screening, unspecified: Secondary | ICD-10-CM

## 2021-12-11 DIAGNOSIS — Z23 Encounter for immunization: Secondary | ICD-10-CM

## 2021-12-11 MED ORDER — CIPROFLOXACIN HCL 500 MG PO TABS: 500.0000 mg | ORAL_TABLET | Freq: Two times a day (BID) | ORAL | 0 refills | Status: AC

## 2021-12-11 MED ORDER — TYPHOID VACCINE PO CPDR: 1.0000 | DELAYED_RELEASE_CAPSULE | ORAL | 0 refills | Status: DC

## 2021-12-11 MED ORDER — ATOVAQUONE-PROGUANIL HCL 250-100 MG PO TABS: 1.0000 | ORAL_TABLET | Freq: Every day | ORAL | 0 refills | Status: DC

## 2021-12-11 NOTE — Progress Notes (Signed)
Subjective: ?Chief Complaint  ?Patient presents with  ? travel to [place]  ?  Travel to [place] may 19th and need shots if needed would like typhoid oral vaccine sent in and needs malaria  ? ?Here for travel advice.  Going to [place] with wife and father in Sports [employer] in May.  Doing a safari.  Going to [place], [place] as well.  Had Hepatitis A and Hepatitis B vaccine in 2015, done at [place].  Feels fine, no other c/o ? ?ROS  ?No current symptoms of concern ? ? ?Objective: ?BP 120/74   Pulse 72   Wt 265 lb 12.8 oz (120.6 kg)   BMI 33.22 kg/m?  ? ?Gen: wd, wn, nad ?Well appearing ? ? ? ?Assessment: ?Encounter Diagnoses  ?Name Primary?  ? Travel advice encounter Yes  ? Need for hepatitis A and B vaccination   ? Screening for condition   ? Need for malaria prophylaxis   ? Need for immunization against typhoid   ? ? ? ? ?Plan: ?Please refer to [facility] website for travel recommendations, several of which we discussed today ? ?Begin malaria prophylactics with Malarone (atovaquone-proguanil), beginning this 2 days before your trip, daily during the trip, and daily for a week after you return from your trip. ? ?Go ahead and complete the typhoid vaccine treatment as labeled ? ?We are checking some labs today to screen for your immunity to mumps measles and rubella ? ?We updated your combined hepatitis A and B vaccine today ? ?You can consider rabies vaccine boosters but this would be done through the health department ? ?I prescribed a medication called Cipro for traveler's diarrhea.  If you get numerous loose stools such as 10+ loose stools for more than 2 or 3 days then you can use Cipro.  However if fever or bloody diarrhea associated with diarrheal illness, then do not use the antibiotic and get evaluated ? ?Drink only bottled water while on your trip and be very cautious to avoid food and water contamination ? ? ?Mateusz was seen today for travel to [place]. ? ?Diagnoses and all orders for this  visit: ? ?Travel advice encounter ?-     Measles/Mumps/Rubella Immunity ? ?Need for hepatitis A and B vaccination ?-     Hepatitis A hepatitis B combined vaccine IM ? ?Screening for condition ?-     Measles/Mumps/Rubella Immunity ? ?Need for malaria prophylaxis ? ?Need for immunization against typhoid ? ?Other orders ?-     atovaquone-proguanil (MALARONE) 250-100 MG TABS tablet; Take 1 tablet by mouth daily. Begin 2 days prior to trip, use daily, continue for 7 days after trip ?-     typhoid (VIVOTIF) DR capsule; Take 1 capsule by mouth every other day. ?-     ciprofloxacin (CIPRO) 500 MG tablet; Take 1 tablet (500 mg total) by mouth 2 (two) times daily for 3 days. ? ? ?F/u pending labs ? ? ?

## 2021-12-11 NOTE — Patient Instructions (Addendum)
Please refer to [facility] website for travel recommendations, several of which we discussed today ? ?Begin malaria prophylactics with Malarone (atovaquone-proguanil), beginning this 2 days before your trip, daily during the trip, and daily for a week after you return from your trip. ? ?Go ahead and complete the typhoid vaccine treatment as labeled ? ?We are checking some labs today to screen for your immunity to mumps measles and rubella ? ?We updated your combined hepatitis A and B vaccine today ? ?You can consider rabies vaccine boosters but this would be done through the health department ? ?I prescribed a medication called Cipro for traveler's diarrhea.  If you get numerous loose stools such as 10+ loose stools for more than 2 or 3 days then you can use Cipro.  However if fever or bloody diarrhea associated with diarrheal illness, then do not use the antibiotic and get evaluated ? ?Drink only bottled water while on your trip and be very cautious to avoid food and water contamination ?

## 2021-12-12 LAB — MEASLES/MUMPS/RUBELLA IMMUNITY
MUMPS ABS, IGG: 93.2 AU/mL (ref >10.9)
RUBEOLA AB, IGG: 66 AU/mL (ref >16.4)
Rubella Antibodies, IGG: 3.95 index (ref >0.99)

## 2022-04-30 ENCOUNTER — Encounter: Payer: Self-pay | Admitting: Internal Medicine

## 2022-09-16 ENCOUNTER — Encounter: Payer: No Typology Code available for payment source | Admitting: Medical

## 2022-09-25 ENCOUNTER — Other Ambulatory Visit: Payer: Self-pay | Admitting: Medical

## 2022-09-25 NOTE — Telephone Encounter (Signed)
Pt has appt next week will only give 30

## 2022-09-25 NOTE — Telephone Encounter (Signed)
Has upcoming appointment

## 2022-09-25 NOTE — Telephone Encounter (Signed)
Pt has an appt next week

## 2022-09-25 NOTE — Telephone Encounter (Signed)
Pt has upcoming appt soon. Only will sent 30 in

## 2022-09-30 ENCOUNTER — Ambulatory Visit (INDEPENDENT_AMBULATORY_CARE_PROVIDER_SITE_OTHER): Payer: No Typology Code available for payment source | Admitting: Medical

## 2022-09-30 ENCOUNTER — Encounter: Payer: Self-pay | Admitting: Medical

## 2022-09-30 VITALS — BP 120/80 | HR 65 | Ht 75.0 in | Wt 267.4 lb

## 2022-09-30 DIAGNOSIS — Z8249 Family history of ischemic heart disease and other diseases of the circulatory system: Secondary | ICD-10-CM

## 2022-09-30 DIAGNOSIS — I10 Essential (primary) hypertension: Secondary | ICD-10-CM

## 2022-09-30 DIAGNOSIS — Z7185 Encounter for immunization safety counseling: Secondary | ICD-10-CM

## 2022-09-30 DIAGNOSIS — E785 Hyperlipidemia, unspecified: Secondary | ICD-10-CM | POA: Diagnosis not present

## 2022-09-30 DIAGNOSIS — Z0000 Encounter for general adult medical examination without abnormal findings: Secondary | ICD-10-CM | POA: Diagnosis not present

## 2022-09-30 DIAGNOSIS — L80 Vitiligo: Secondary | ICD-10-CM

## 2022-09-30 DIAGNOSIS — Z131 Encounter for screening for diabetes mellitus: Secondary | ICD-10-CM

## 2022-09-30 NOTE — Progress Notes (Signed)
Subjective:   HPI  Ronnie Bailey is a 42 y.o. male who presents for Chief Complaint  Patient presents with   Annual Exam    Pt here for annual physical. Continue conversation about exercise and blood pressure    Patient Care Team: Cassidy Tabet, Leward Quan as PCP - General (Family Medicine) Josue Hector, MD as PCP - Cardiology (Cardiology)  Magnus Sinning, [place] with [facility] dermatology Sees dentist Sees eye doctor Dr. Wilfrid Lund, GI  Concerns: Hypertension - taking Valsartan HCT and aspirin daily.  Not checking BPs regularly  Hyperlipdiema - taking statin 3 days per week  Asthma - no recent issues.  Reviewed their medical, surgical, family, social, medication, and allergy history and updated chart as appropriate.  Past Medical History:  Diagnosis Date   Allergic rhinitis    Asthma    childhood mostly   Family history of macular degeneration    Family history of premature CAD    Headache    occasional   Hyperlipidemia    Hypertension    related to obesity at 290lb   Vitiligo    Wears glasses     Past Surgical History:  Procedure Laterality Date   DENTAL SURGERY     LACERATION REPAIR     VASECTOMY  10/2020    Social History   Socioeconomic History   Marital status: Married    Spouse name: Not on file   Number of children: Not on file   Years of education: Not on file   Highest education level: Bachelor's degree (e.g., BA, AB, BS)  Occupational History   Not on file  Tobacco Use   Smoking status: Former    Packs/day: 1.00    Years: 10.00    Total pack years: 10.00    Types: Cigarettes    Quit date: 06/26/2007    Years since quitting: 15.2   Smokeless tobacco: Never  Vaping Use   Vaping Use: Never used  Substance and Sexual Activity   Alcohol use: Yes    Alcohol/week: 5.0 standard drinks of alcohol    Types: 1 Glasses of wine, 2 Cans of beer, 2 Shots of liquor per week    Comment: more at holidays late in year, otherwise as noted   Drug use: Yes     Types: Marijuana   Sexual activity: Yes    Partners: Female  Other Topics Concern   Not on file  Social History Narrative   Married, no children, has some cats, working in Scientist, research (physical sciences).  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  Exercise with cardio and doing calisthenics, disc golf.   09/2022   Social Determinants of Health   Financial Resource Strain: Low Risk  (12/10/2021)   Overall Financial Resource Strain (CARDIA)    Difficulty of Paying Living Expenses: Not hard at all  Food Insecurity: No Food Insecurity (12/10/2021)   Hunger Vital Sign    Worried About Running Out of Food in the Last Year: Never true    Ran Out of Food in the Last Year: Never true  Transportation Needs: No Transportation Needs (12/10/2021)   PRAPARE - Hydrologist (Medical): No    Lack of Transportation (Non-Medical): No  Physical Activity: Insufficiently Active (12/10/2021)   Exercise Vital Sign    Days of Exercise per Week: 2 days    Minutes of Exercise per Session: 30 min  Stress: No Stress Concern Present (12/10/2021)   [facility] -  Occupational Stress Questionnaire    Feeling of Stress : Only a little  Social Connections: Moderately Isolated (12/10/2021)   Social Connection and Isolation Panel [NHANES]    Frequency of Communication with Friends and Family: Three times a week    Frequency of Social Gatherings with Friends and Family: Twice a week    Attends Religious Services: Never    Marine scientist or Organizations: No    Attends Music therapist: Not on file    Marital Status: Married  Human resources officer Violence: Not on file    Family History  Problem Relation Age of Onset   Heart disease Mother        valve disease   Diabetes Mother    Hypotension Mother    Other Mother        macular disease, genetic, somewhat rare   Arthritis Mother    Heart disease Father 49       MI   Hypertension Father     Vitiligo Father    AAA (abdominal aortic aneurysm) Father    Hypertension Brother    Other Brother        eye disease   Hypertension Brother    Cancer Brother        skin   Kidney disease Maternal Grandmother    Diabetes Maternal Grandfather    Stomach cancer Maternal Grandfather    Cancer Maternal Grandfather        stomach   Alzheimer's disease Paternal Grandmother    Colon cancer Neg Hx    Esophageal cancer Neg Hx    Rectal cancer Neg Hx    Pancreatic cancer Neg Hx      Current Outpatient Medications:    aspirin EC 81 MG tablet, Take 1 tablet (81 mg total) by mouth daily., Disp: 90 tablet, Rfl: 3   atorvastatin (LIPITOR) 10 MG tablet, TAKE 1 TABLET BY MOUTH EVERY OTHER DAY, Disp: 30 tablet, Rfl: 0   valsartan-hydrochlorothiazide (DIOVAN-HCT) 160-12.5 MG tablet, TAKE 1 TABLET BY MOUTH DAILY, Disp: 30 tablet, Rfl: 0  Allergies  Allergen Reactions   Oysters [Shellfish Allergy] Diarrhea    Stomach pain and diarrhea   Review of Systems  Constitutional:  Negative for chills, fever, malaise/fatigue and weight loss.  HENT:  Negative for congestion, ear pain, hearing loss, sore throat and tinnitus.   Eyes:  Negative for blurred vision, pain and redness.  Respiratory:  Negative for cough, hemoptysis and shortness of breath.   Cardiovascular:  Negative for chest pain, palpitations, orthopnea, claudication and leg swelling.  Gastrointestinal:  Negative for abdominal pain, blood in stool, constipation, diarrhea, nausea and vomiting.  Genitourinary:  Negative for dysuria, flank pain, frequency, hematuria and urgency.  Musculoskeletal:  Negative for falls, joint pain and myalgias.  Skin:  Negative for itching and rash.  Neurological:  Negative for dizziness, tingling, speech change, weakness and headaches.  Endo/Heme/Allergies:  Negative for polydipsia. Does not bruise/bleed easily.  Psychiatric/Behavioral:  Negative for depression and memory loss. The patient is not nervous/anxious  and does not have insomnia.       Objective:  BP (!) 128/90   Pulse 65   Ht 6\' 3"  (1.905 m)   Wt 267 lb 6.4 oz (121.3 kg)   BMI 33.42 kg/m   Wt Readings from Last 3 Encounters:  09/30/22 267 lb 6.4 oz (121.3 kg)  12/11/21 265 lb 12.8 oz (120.6 kg)  09/13/21 255 lb (115.7 kg)   BP Readings from Last 3 Encounters:  09/30/22 (!) 128/90  12/11/21 120/74  09/13/21 130/88    General appearance: alert, no distress, WD/WN, Caucasian male Skin: He has patches of vitiligo throughout body, arms, GU, around hips, no worrisome lesions HEENT: normocephalic, conjunctiva/corneas normal, sclerae anicteric, PERRLA, EOMi Neck: supple, no lymphadenopathy, no thyromegaly, no masses, normal ROM, no bruits Chest: non tender, normal shape and expansion Heart: RRR, normal S1, S2, no murmurs Lungs: CTA bilaterally, no wheezes, rhonchi, or rales Abdomen: +bs, soft, non tender, non distended, no masses, no hepatomegaly, no splenomegaly, no bruits Back: non tender, normal ROM, no scoliosis Musculoskeletal: shoulders exam unremarkable, upper extremities non tender, no obvious deformity, normal ROM throughout, lower extremities non tender, no obvious deformity, normal ROM throughout Extremities: no edema, no cyanosis, no clubbing Pulses: 2+ symmetric, upper and lower extremities, normal cap refill Neurological: alert, oriented x 3, CN2-12 intact, strength normal upper extremities and lower extremities, sensation normal throughout, DTRs 2+ throughout, no cerebellar signs, gait normal Psychiatric: normal affect, behavior normal, pleasant  GU: normal male external genitalia,circ, nontender, no masses, no hernia, no lymphadenopathy Rectal: declined     Assessment and Plan :   Encounter Diagnoses  Name Primary?   Routine general medical examination at a health care facility Yes   Vaccine counseling    Hyperlipidemia, unspecified hyperlipidemia type    Family history of premature coronary artery disease     Essential hypertension, benign    Vitiligo      Physical exam - discussed and counseled on healthy lifestyle, diet, exercise, preventative care, vaccinations, sick and well care, proper use of emergency dept and after hours care, and addressed their concerns.    Health screening: See your eye doctor yearly for routine vision care. See your dentist yearly for routine dental care including hygiene visits twice yearly.  Cancer screening Advised monthly self testicular exam  advised prostate cancer screening age 53yo  Reviewed 2020 colonoscopy report.   Vaccinations: Immunization History  Administered Date(s) Administered   COVID-19, mRNA, vaccine(Comirnaty)12 years and older 06/11/2022   Hep A / Hep B 12/11/2021   Hepatitis A 02/09/2014   Hepatitis B 02/09/2014   Influenza Inj Mdck Quad Pf 07/02/2018   Influenza-Unspecified 07/02/2018, 06/03/2019, 06/19/2020, 05/16/2021, 06/11/2022   Moderna Sars-Covid-2 Vaccination 07/10/2020   PFIZER(Purple Top)SARS-COV-2 Vaccination 11/04/2019, 11/30/2019   Pfizer Covid-19 Vaccine Bivalent Booster 50yrs & up 05/16/2021   Pneumococcal Polysaccharide-23 12/27/2014   Tdap 04/22/2018   Typhoid Live 02/09/2014     Separate significant  issues discussed: Hypertension-continue valsartan HCT  Hyperlipidemia-continue statin 3 days per week, routine labs today.  CT coronary score with minimal CAD in 2019  Obesity-continue efforts to lose weight through healthy diet and exercise  Routine labs today   Chioke was seen today for annual exam.  Diagnoses and all orders for this visit:  Routine general medical examination at a health care facility  Vaccine counseling  Hyperlipidemia, unspecified hyperlipidemia type  Family history of premature coronary artery disease  Essential hypertension, benign  Vitiligo    Follow-up pending labs, yearly for physical

## 2022-10-01 ENCOUNTER — Other Ambulatory Visit: Payer: Self-pay | Admitting: Medical

## 2022-10-01 LAB — COMPREHENSIVE METABOLIC PANEL
ALT: 41 IU/L (ref 0–44)
AST: 31 IU/L (ref 0–40)
Albumin/Globulin Ratio: 1.6 (ref 1.2–2.2)
Albumin: 4.6 g/dL (ref 4.1–5.1)
Alkaline Phosphatase: 43 IU/L — ABNORMAL LOW (ref 44–121)
BUN/Creatinine Ratio: 12 (ref 9–20)
BUN: 13 mg/dL (ref 6–24)
Bilirubin Total: 0.7 mg/dL (ref 0.0–1.2)
CO2: 24 mmol/L (ref 20–29)
Calcium: 9.5 mg/dL (ref 8.7–10.2)
Chloride: 101 mmol/L (ref 96–106)
Creatinine, Ser: 1.08 mg/dL (ref 0.76–1.27)
Globulin, Total: 2.8 g/dL (ref 1.5–4.5)
Glucose: 84 mg/dL (ref 70–99)
Potassium: 4.6 mmol/L (ref 3.5–5.2)
Sodium: 139 mmol/L (ref 134–144)
Total Protein: 7.4 g/dL (ref 6.0–8.5)
eGFR: 87 mL/min/{1.73_m2} (ref >59)

## 2022-10-01 LAB — CBC
Hematocrit: 45.7 % (ref 37.5–51.0)
Hemoglobin: 14.9 g/dL (ref 13.0–17.7)
MCH: 28.2 pg (ref 26.6–33.0)
MCHC: 32.6 g/dL (ref 31.5–35.7)
MCV: 86 fL (ref 79–97)
Platelets: 242 10*3/uL (ref 150–450)
RBC: 5.29 x10E6/uL (ref 4.14–5.80)
RDW: 13.2 % (ref 11.6–15.4)
WBC: 5.7 10*3/uL (ref 3.4–10.8)

## 2022-10-01 LAB — HEMOGLOBIN A1C
Est. average glucose Bld gHb Est-mCnc: 111 mg/dL
Hgb A1c MFr Bld: 5.5 % (ref 4.8–5.6)

## 2022-10-01 LAB — LIPID PANEL
Chol/HDL Ratio: 3.3 ratio (ref 0.0–5.0)
Cholesterol, Total: 143 mg/dL (ref 100–199)
HDL: 43 mg/dL (ref >39)
LDL Chol Calc (NIH): 80 mg/dL (ref 0–99)
Triglycerides: 112 mg/dL (ref 0–149)
VLDL Cholesterol Cal: 20 mg/dL (ref 5–40)

## 2022-10-01 MED ORDER — VALSARTAN-HYDROCHLOROTHIAZIDE 160-12.5 MG PO TABS: 1.0000 | ORAL_TABLET | Freq: Every day | ORAL | 2 refills | Status: DC

## 2022-10-01 MED ORDER — ATORVASTATIN CALCIUM 10 MG PO TABS: 10.0000 mg | ORAL_TABLET | ORAL | 2 refills | Status: DC

## 2022-10-01 MED ORDER — ASPIRIN 81 MG PO TBEC: 81.0000 mg | DELAYED_RELEASE_TABLET | Freq: Every day | ORAL | 3 refills | Status: DC

## 2022-10-01 NOTE — Progress Notes (Signed)
Results sent through MyChart

## 2023-01-14 ENCOUNTER — Telehealth: Payer: No Typology Code available for payment source | Admitting: Medical

## 2023-01-14 VITALS — Wt 255.0 lb

## 2023-01-14 DIAGNOSIS — K5900 Constipation, unspecified: Secondary | ICD-10-CM | POA: Diagnosis not present

## 2023-01-14 DIAGNOSIS — R140 Abdominal distension (gaseous): Secondary | ICD-10-CM | POA: Diagnosis not present

## 2023-01-14 NOTE — Progress Notes (Signed)
Subjective:     Patient ID: Ronnie Bailey, male   DOB: 10/25/1980, 42 y.o.   MRN: [id number]  This visit type was conducted due to national recommendations for restrictions regarding the COVID-19 Pandemic (e.g. social distancing) in an effort to limit this patient's exposure and mitigate transmission in our community.  Due to their co-morbid illnesses, this patient is at least at moderate risk for complications without adequate follow up.  This format is felt to be most appropriate for this patient at this time.    Documentation for virtual audio and video telecommunications through [facility] encounter:  The patient was located at home. The provider was located in the office. The patient did consent to this visit and is aware of possible charges through their insurance for this visit.  The other persons participating in this telemedicine service were none. Time spent on call was 20 minutes and in review of previous records 20 minutes total.  This virtual service is not related to other E/M service within previous 7 days.   HPI Chief Complaint  Patient presents with   Consult    Having some constipation, started Wednesday/ Thursday last week and seems to have gotten 60-70% better but going out of town and wants to talk    Virtual for constipation issues.  He normally does not have issues with his bowels.  He usually has a bowel movement every single day in the morning either before during or after making coffee.  However this past week, they went to the food truck festival and ate from a food truck some tacos and fried artichokes.  Later that evening him and his wife both had some issues with her stomach including nausea and bowel upset.  Over the next few days he had a lot of problems with constipation, did not have a bowel movement for a few days.  Initially had some loose stool after the food truck meal later that evening.  But then this turned to constipation.  He had nausea  without vomiting.  He did some Pepto-Bismol.  Over the next day or so he has some relief  He drinks a lot of water in general.  He has limited his alcohol in general but does drink some and did drink some over the last weekend  He had pressure and bloating until 3 days ago, Sunday.  Since then he has had some improvements and finally had a bowel movement Sunday after a few days of not having a bowel movement at all.  Today he is about 60 to 70% better but still feels like there is some left to come out.  Used some Magnesium Citrate which helped quite a bit.    Watching diet.  Last few days using salads, careful with what he eats.  Leaving for [place] this coming weekend, he wants some advice on this.  No other aggravating or relieving factors. No other complaint.   Past Medical History:  Diagnosis Date   Allergic rhinitis    Asthma    childhood mostly   Family history of macular degeneration    Family history of premature CAD    Headache    occasional   Hyperlipidemia    Hypertension    related to obesity at 290lb   Vitiligo    Wears glasses    Current Outpatient Medications on File Prior to Visit  Medication Sig Dispense Refill   aspirin EC 81 MG tablet Take 1 tablet (81 mg total) by mouth daily. 90  tablet 3   atorvastatin (LIPITOR) 10 MG tablet Take 1 tablet (10 mg total) by mouth every other day. 90 tablet 2   valsartan-hydrochlorothiazide (DIOVAN-HCT) 160-12.5 MG tablet Take 1 tablet by mouth daily. 90 tablet 2   No current facility-administered medications on file prior to visit.     Review of Systems As in subjective    Objective:   Physical Exam Due to coronavirus pandemic stay at home measures, patient visit was virtual and they were not examined in person.   Wt 255 lb (115.7 kg)   BMI 31.87 kg/m   Wt 255 lb (115.7 kg)   BMI 31.87 kg/m   Gen: wd, wn, nad      Assessment:     Encounter Diagnoses  Name Primary?   Acute constipation Yes    Bloating        Plan:     We discussed symptoms and concerns.  Advise clear fluid diet for the next day and a half or smoothies or pured foods the next day and a half.  Continue with really good water intake throughout the day.  Consider MiraLAX once daily or twice daily for a few days.  Alternatively if he goes a few days without a bowel movement at all he can do 1 dose of MiraLAX hourly for 3 to 4 hours if needed to clear things out.  We discussed his upcoming trip to [place] this [place].  We discussed taking some as needed medication with him such as Pepto-Bismol, MiraLAX, milk of magnesia.  We discussed proper use of medication to help with constipation.  He does not have a history of chronic constipation.  Call or recheck if not improving  Ronnie Bailey was seen today for consult.  Diagnoses and all orders for this visit:  Acute constipation  Bloating  F/u prn

## 2023-09-10 ENCOUNTER — Other Ambulatory Visit: Payer: Self-pay | Admitting: Medical

## 2023-10-06 ENCOUNTER — Encounter: Payer: No Typology Code available for payment source | Admitting: Medical

## 2023-11-04 ENCOUNTER — Ambulatory Visit: Payer: No Typology Code available for payment source | Admitting: Medical

## 2023-11-04 ENCOUNTER — Encounter: Payer: Self-pay | Admitting: Medical

## 2023-11-04 VITALS — BP 126/80 | HR 63 | Ht 75.0 in | Wt 259.2 lb

## 2023-11-04 DIAGNOSIS — Z0000 Encounter for general adult medical examination without abnormal findings: Secondary | ICD-10-CM | POA: Diagnosis not present

## 2023-11-04 DIAGNOSIS — Z8249 Family history of ischemic heart disease and other diseases of the circulatory system: Secondary | ICD-10-CM | POA: Diagnosis not present

## 2023-11-04 DIAGNOSIS — I10 Essential (primary) hypertension: Secondary | ICD-10-CM | POA: Diagnosis not present

## 2023-11-04 DIAGNOSIS — E785 Hyperlipidemia, unspecified: Secondary | ICD-10-CM

## 2023-11-04 LAB — LIPID PANEL

## 2023-11-04 NOTE — Progress Notes (Signed)
 Subjective:   HPI  Ronnie Bailey is a 43 y.o. male who presents for Chief Complaint  Patient presents with   Annual Exam    Fasting cpe, no concerns, seeing counselor due to issues with mother    Patient Care Team: Tatiyana Foucher, Cleda Mccreedy as PCP - General (Family Medicine) Wendall Stade, MD as PCP - Cardiology (Cardiology)  Ginger Organ, [place] with [facility] dermatology Sees dentist Sees eye doctor Dr. Amada Jupiter, GI  Concerns: Here for well visit.  He is compliant with his blood pressure medicine.  Blood pressures look good at home.  He checks them about weekly  Compliant with Lipitor and aspirin without complaint  No asthma problems since childhood.  He had flu and COVID vaccines in the fall   Reviewed their medical, surgical, family, social, medication, and allergy history and updated chart as appropriate.  Past Medical History:  Diagnosis Date   Allergic rhinitis    Asthma    childhood mostly   Family history of macular degeneration    Family history of premature CAD    Headache    occasional   Hyperlipidemia    Hypertension    related to obesity at 290lb   Vitiligo    Wears glasses     Past Surgical History:  Procedure Laterality Date   DENTAL SURGERY     LACERATION REPAIR     VASECTOMY  10/2020    Social History   Socioeconomic History   Marital status: Married    Spouse name: Not on file   Number of children: Not on file   Years of education: Not on file   Highest education level: Bachelor's degree (e.g., BA, AB, BS)  Occupational History   Not on file  Tobacco Use   Smoking status: Former    Current packs/day: 0.00    Average packs/day: 1 pack/day for 10.0 years (10.0 ttl pk-yrs)    Types: Cigarettes    Start date: 06/25/1997    Quit date: 06/26/2007    Years since quitting: 16.3   Smokeless tobacco: Never  Vaping Use   Vaping status: Never Used  Substance and Sexual Activity   Alcohol use: Yes    Alcohol/week: 5.0 standard drinks  of alcohol    Types: 1 Glasses of wine, 2 Cans of beer, 2 Shots of liquor per week   Drug use: Yes    Types: Marijuana   Sexual activity: Yes    Partners: Female  Other Topics Concern   Not on file  Social History Narrative   Married, no children, has some cats, working in Paramedic.  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  Exercise with cardio and doing calisthenics, disc golf.   10/2023   Social Drivers of Health   Financial Resource Strain: Low Risk  (11/03/2023)   Overall Financial Resource Strain (CARDIA)    Difficulty of Paying Living Expenses: Not hard at all  Food Insecurity: No Food Insecurity (11/03/2023)   Hunger Vital Sign    Worried About Running Out of Food in the Last Year: Never true    Ran Out of Food in the Last Year: Never true  Transportation Needs: No Transportation Needs (11/03/2023)   PRAPARE - Administrator, Civil Service (Medical): No    Lack of Transportation (Non-Medical): No  Physical Activity: Insufficiently Active (11/03/2023)   Exercise Vital Sign    Days of Exercise per Week: 2 days    Minutes of Exercise per Session:  30 min  Stress: Stress Concern Present (11/03/2023)   [employer] of Occupational Health - Occupational Stress Questionnaire    Feeling of Stress : To some extent  Social Connections: Moderately Isolated (11/03/2023)   Social Connection and Isolation Panel [NHANES]    Frequency of Communication with Friends and Family: More than three times a week    Frequency of Social Gatherings with Friends and Family: Three times a week    Attends Religious Services: Never    Active Member of Clubs or Organizations: No    Attends Engineer, structural: Not on file    Marital Status: Married  Catering manager Violence: Not on file    Family History  Problem Relation Age of Onset   Heart disease Mother        valve disease   Diabetes Mother    Hypotension Mother    Other Mother        macular  disease, genetic, somewhat rare   Arthritis Mother    Heart disease Father 9       MI   Hypertension Father    Vitiligo Father    AAA (abdominal aortic aneurysm) Father    Hypertension Brother    Other Brother        eye disease   Hypertension Brother    Cancer Brother        skin   Kidney disease Maternal Grandmother    Diabetes Maternal Grandfather    Stomach cancer Maternal Grandfather    Cancer Maternal Grandfather        stomach   Alzheimer's disease Paternal Grandmother    Colon cancer Neg Hx    Esophageal cancer Neg Hx    Rectal cancer Neg Hx    Pancreatic cancer Neg Hx      Current Outpatient Medications:    aspirin EC 81 MG tablet, Take 1 tablet (81 mg total) by mouth daily., Disp: 90 tablet, Rfl: 3   atorvastatin (LIPITOR) 10 MG tablet, Take 1 tablet (10 mg total) by mouth every other day., Disp: 90 tablet, Rfl: 2   valsartan-hydrochlorothiazide (DIOVAN-HCT) 160-12.5 MG tablet, TAKE 1 TABLET BY MOUTH DAILY, Disp: 90 tablet, Rfl: 0  Allergies  Allergen Reactions   Oysters [Shellfish Allergy] Diarrhea    Stomach pain and diarrhea   Review of Systems  Constitutional:  Negative for chills, fever, malaise/fatigue and weight loss.  HENT:  Negative for congestion, ear pain, hearing loss, sore throat and tinnitus.   Eyes:  Negative for blurred vision, pain and redness.  Respiratory:  Negative for cough, hemoptysis and shortness of breath.   Cardiovascular:  Negative for chest pain, palpitations, orthopnea, claudication and leg swelling.  Gastrointestinal:  Negative for abdominal pain, blood in stool, constipation, diarrhea, nausea and vomiting.  Genitourinary:  Negative for dysuria, flank pain, frequency, hematuria and urgency.  Musculoskeletal:  Negative for falls, joint pain and myalgias.  Skin:  Negative for itching and rash.  Neurological:  Negative for dizziness, tingling, speech change, weakness and headaches.  Endo/Heme/Allergies:  Negative for polydipsia.  Does not bruise/bleed easily.  Psychiatric/Behavioral:  Negative for depression and memory loss. The patient is not nervous/anxious and does not have insomnia.       Objective:  BP 126/80   Pulse 63   Ht 6\' 3"  (1.905 m)   Wt 259 lb 3.2 oz (117.6 kg)   SpO2 98%   BMI 32.40 kg/m   Wt Readings from Last 3 Encounters:  11/04/23 259 lb 3.2 oz (  117.6 kg)  01/14/23 255 lb (115.7 kg)  09/30/22 267 lb 6.4 oz (121.3 kg)   BP Readings from Last 3 Encounters:  11/04/23 126/80  09/30/22 120/80  12/11/21 120/74    General appearance: alert, no distress, WD/WN, Caucasian male Skin: He has patches of vitiligo throughout body, arms, GU, around hips, no worrisome lesions HEENT: normocephalic, conjunctiva/corneas normal, sclerae anicteric, PERRLA, EOMi Neck: supple, no lymphadenopathy, no thyromegaly, no masses, normal ROM, no bruits Chest: non tender, normal shape and expansion Heart: RRR, normal S1, S2, no murmurs Lungs: CTA bilaterally, no wheezes, rhonchi, or rales Abdomen: +bs, soft, non tender, non distended, no masses, no hepatomegaly, no splenomegaly, no bruits Back: non tender, normal ROM Musculoskeletal: limited exam unremarkable Extremities: no edema, no cyanosis, no clubbing Pulses: 2+ symmetric, upper and lower extremities, normal cap refill Neurological: alert, oriented x 3, CN2-12 intact, strength normal upper extremities and lower extremities, sensation normal throughout, DTRs 2+ throughout, no cerebellar signs, gait normal Psychiatric: normal affect, behavior normal, pleasant  GU: normal male external genitalia,circ, nontender, no masses, no hernia, no lymphadenopathy Rectal: declined     Assessment and Plan :   Encounter Diagnoses  Name Primary?   Encounter for health maintenance examination in adult Yes   Hyperlipidemia, unspecified hyperlipidemia type    Essential hypertension, benign    Family history of premature coronary artery disease     Physical exam -  discussed and counseled on healthy lifestyle, diet, exercise, preventative care, vaccinations, sick and well care, proper use of emergency dept and after hours care, and addressed their concerns.    Health screening: See your eye doctor yearly for routine vision care. See your dentist yearly for routine dental care including hygiene visits twice yearly.  Cancer screening I recommend a monthly self testicular exam  Advised prostate cancer screening age 46yo  Reviewed 2020 colonoscopy report.  Repeat age 30   Vaccinations: Immunization History  Administered Date(s) Administered   Hep A / Hep B 12/11/2021   Hepatitis A 02/09/2014   Hepatitis B 02/09/2014   Influenza Inj Mdck Quad Pf 07/02/2018   Influenza-Unspecified 07/02/2018, 06/03/2019, 06/19/2020, 05/16/2021, 06/11/2022   Moderna Sars-Covid-2 Vaccination 07/10/2020   PFIZER(Purple Top)SARS-COV-2 Vaccination 11/04/2019, 11/30/2019   Pfizer Covid-19 Vaccine Bivalent Booster 79yrs & up 05/16/2021   Pfizer(Comirnaty)Fall Seasonal Vaccine 12 years and older 06/11/2022   Pneumococcal Polysaccharide-23 12/27/2014   Tdap 04/22/2018   Typhoid Live 02/09/2014     Separate significant  issues discussed: Hypertension-continue valsartan HCT 160/12.5mg  daily  Hyperlipidemia-continue statin Lipitor 10mg  daily and Asprin 81mg  daily, routine labs today.  CT coronary score with minimal CAD in 2019  Routine labs today    Dara was seen today for annual exam.  Diagnoses and all orders for this visit:  Encounter for health maintenance examination in adult -     CBC -     Comprehensive metabolic panel -     Lipid panel  Hyperlipidemia, unspecified hyperlipidemia type -     Lipid panel  Essential hypertension, benign  Family history of premature coronary artery disease    Follow-up pending labs, yearly for physical

## 2023-11-05 ENCOUNTER — Other Ambulatory Visit: Payer: Self-pay | Admitting: Medical

## 2023-11-05 LAB — COMPREHENSIVE METABOLIC PANEL
ALT: 42 IU/L (ref 0–44)
AST: 32 IU/L (ref 0–40)
Albumin: 4.9 g/dL (ref 4.1–5.1)
Alkaline Phosphatase: 49 IU/L (ref 44–121)
BUN/Creatinine Ratio: 11 (ref 9–20)
BUN: 11 mg/dL (ref 6–24)
Bilirubin Total: 0.6 mg/dL (ref 0.0–1.2)
CO2: 22 mmol/L (ref 20–29)
Calcium: 9.6 mg/dL (ref 8.7–10.2)
Chloride: 102 mmol/L (ref 96–106)
Creatinine, Ser: 1.03 mg/dL (ref 0.76–1.27)
Globulin, Total: 2.9 g/dL (ref 1.5–4.5)
Glucose: 78 mg/dL (ref 70–99)
Potassium: 4.1 mmol/L (ref 3.5–5.2)
Sodium: 140 mmol/L (ref 134–144)
Total Protein: 7.8 g/dL (ref 6.0–8.5)
eGFR: 92 mL/min/{1.73_m2} (ref >59)

## 2023-11-05 LAB — CBC
Hematocrit: 47.3 % (ref 37.5–51.0)
Hemoglobin: 16 g/dL (ref 13.0–17.7)
MCH: 29.3 pg (ref 26.6–33.0)
MCHC: 33.8 g/dL (ref 31.5–35.7)
MCV: 87 fL (ref 79–97)
Platelets: 265 10*3/uL (ref 150–450)
RBC: 5.47 x10E6/uL (ref 4.14–5.80)
RDW: 13.1 % (ref 11.6–15.4)
WBC: 7.8 10*3/uL (ref 3.4–10.8)

## 2023-11-05 LAB — LIPID PANEL
Cholesterol, Total: 139 mg/dL (ref 100–199)
HDL: 42 mg/dL (ref >39)
LDL CALC COMMENT:: 3.3 ratio (ref 0.0–5.0)
LDL Chol Calc (NIH): 72 mg/dL (ref 0–99)
Triglycerides: 140 mg/dL (ref 0–149)
VLDL Cholesterol Cal: 25 mg/dL (ref 5–40)

## 2023-11-05 MED ORDER — ASPIRIN 81 MG PO TBEC: 81.0000 mg | DELAYED_RELEASE_TABLET | Freq: Every day | ORAL | 3 refills | Status: AC

## 2023-11-05 MED ORDER — ATORVASTATIN CALCIUM 10 MG PO TABS: 10.0000 mg | ORAL_TABLET | ORAL | 3 refills | Status: DC

## 2023-11-05 MED ORDER — VALSARTAN-HYDROCHLOROTHIAZIDE 160-12.5 MG PO TABS: 1.0000 | ORAL_TABLET | Freq: Every day | ORAL | 3 refills | Status: AC

## 2023-11-05 NOTE — Progress Notes (Signed)
 Results sent through MyChart

## 2023-12-12 ENCOUNTER — Other Ambulatory Visit: Payer: Self-pay | Admitting: Medical

## 2023-12-14 MED ORDER — ATORVASTATIN CALCIUM 10 MG PO TABS: 10.0000 mg | ORAL_TABLET | ORAL | 3 refills | Status: AC

## 2023-12-14 NOTE — Telephone Encounter (Signed)
 Already refilled

## 2024-02-10 ENCOUNTER — Ambulatory Visit (INDEPENDENT_AMBULATORY_CARE_PROVIDER_SITE_OTHER): Payer: Self-pay | Admitting: Orthopaedic Surgery

## 2024-02-10 ENCOUNTER — Ambulatory Visit (HOSPITAL_BASED_OUTPATIENT_CLINIC_OR_DEPARTMENT_OTHER)

## 2024-02-10 DIAGNOSIS — M79672 Pain in left foot: Secondary | ICD-10-CM | POA: Diagnosis not present

## 2024-02-10 NOTE — Progress Notes (Signed)
 Chief Complaint: Left foot pain     History of Present Illness:    Ronnie Bailey is a 43 y.o. male presents today with ongoing left foot pain after an injury where he missed 2 steps while in [place].  He did have some medial ankle pain at that time although he attempted to treat this conservatively.  He states that that pain has resolved as well as the swelling although now he has been experiencing pain predominantly on the bottom aspect of the foot as well as some tenderness over the Achilles.  He works as a Cytogeneticist.  He does enjoy being active.  He has a pending upcoming trip coming to [facility] to compete in a competitive poker competition.    PMH/PSH/Family History/Social History/Meds/Allergies:    Past Medical History:  Diagnosis Date   Allergic rhinitis    Asthma    childhood mostly   Family history of macular degeneration    Family history of premature CAD    Headache    occasional   Hyperlipidemia    Hypertension    related to obesity at 290lb   Vitiligo    Wears glasses    Past Surgical History:  Procedure Laterality Date   DENTAL SURGERY     LACERATION REPAIR     VASECTOMY  10/2020   Social History   Socioeconomic History   Marital status: Married    Spouse name: Not on file   Number of children: Not on file   Years of education: Not on file   Highest education level: Bachelor's degree (e.g., BA, AB, BS)  Occupational History   Not on file  Tobacco Use   Smoking status: Former    Current packs/day: 0.00    Average packs/day: 1 pack/day for 10.0 years (10.0 ttl pk-yrs)    Types: Cigarettes    Start date: 06/25/1997    Quit date: 06/26/2007    Years since quitting: 16.6   Smokeless tobacco: Never  Vaping Use   Vaping status: Never Used  Substance and Sexual Activity   Alcohol use: Yes    Alcohol/week: 5.0 standard drinks of alcohol    Types: 1 Glasses of wine, 2 Cans of beer, 2 Shots of liquor per week   Drug use: Yes    Types:  Marijuana   Sexual activity: Yes    Partners: Female  Other Topics Concern   Not on file  Social History Narrative   Married, no children, has some cats, 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  Exercise with cardio and doing calisthenics, disc golf.   10/2023   Social Drivers of Health   Financial Resource Strain: Low Risk  (11/03/2023)   Overall Financial Resource Strain (CARDIA)    Difficulty of Paying Living Expenses: Not hard at all  Food Insecurity: No Food Insecurity (11/03/2023)   Hunger Vital Sign    Worried About Running Out of Food in the Last Year: Never true    Ran Out of Food in the Last Year: Never true  Transportation Needs: No Transportation Needs (11/03/2023)   PRAPARE - Administrator, Civil Service (Medical): No    Lack of Transportation (Non-Medical): No  Physical Activity: Insufficiently Active (11/03/2023)   Exercise Vital Sign    Days of Exercise per Week: 2 days    Minutes of Exercise per Session: 30 min  Stress: Stress Concern Present (11/03/2023)   [employer] of Occupational Health -  Occupational Stress Questionnaire    Feeling of Stress : To some extent  Social Connections: Moderately Isolated (11/03/2023)   Social Connection and Isolation Panel    Frequency of Communication with Friends and Family: More than three times a week    Frequency of Social Gatherings with Friends and Family: Three times a week    Attends Religious Services: Never    Active Member of Clubs or Organizations: No    Attends Engineer, structural: Not on file    Marital Status: Married   Family History  Problem Relation Age of Onset   Heart disease Mother        valve disease   Diabetes Mother    Hypotension Mother    Other Mother        macular disease, genetic, somewhat rare   Arthritis Mother    Heart disease Father 45       MI   Hypertension Father    Vitiligo Father    AAA (abdominal aortic aneurysm)  Father    Hypertension Brother    Other Brother        eye disease   Hypertension Brother    Cancer Brother        skin   Kidney disease Maternal Grandmother    Diabetes Maternal Grandfather    Stomach cancer Maternal Grandfather    Cancer Maternal Grandfather        stomach   Alzheimer's disease Paternal Grandmother    Colon cancer Neg Hx    Esophageal cancer Neg Hx    Rectal cancer Neg Hx    Pancreatic cancer Neg Hx    Allergies  Allergen Reactions   Oysters [Shellfish Allergy] Diarrhea    Stomach pain and diarrhea   Current Outpatient Medications  Medication Sig Dispense Refill   aspirin  EC 81 MG tablet Take 1 tablet (81 mg total) by mouth daily. 90 tablet 3   atorvastatin  (LIPITOR) 10 MG tablet Take 1 tablet (10 mg total) by mouth every other day. 90 tablet 3   valsartan -hydrochlorothiazide  (DIOVAN -HCT) 160-12.5 MG tablet Take 1 tablet by mouth daily. 90 tablet 3   No current facility-administered medications for this visit.   No results found.  Review of Systems:   A ROS was performed including pertinent positives and negatives as documented in the HPI.  Physical Exam :   Constitutional: NAD and appears stated age Neurological: Alert and oriented Psych: Appropriate affect and cooperative There were no vitals taken for this visit.   Comprehensive Musculoskeletal Exam:    Left foot with tenderness about the plantar aspect of the foot medially about the plantar fascia.  There is some Achilles tightness and pain with passive dorsiflexion.  Otherwise normal examination   Imaging:   Xray (3 views left foot): Normal     I personally reviewed and interpreted the radiographs.   Assessment and Plan:   43 y.o. male with left foot pain consistent with plantar fasciitis and Achilles tendinitis.  I did counsel him on a stretching routine as well as some stretches he can perform with a racquetball.  I did give him a specific set of YouTube stretches as well.  He will  plan to proceed with these.  We also plan for referral to Dr. Vaughn Georges discussion of shockwave therapy of the Achilles and the plantar fascia.   I personally saw and evaluated the patient, and participated in the management and treatment plan.  Wilhelmenia Harada, MD Attending Physician, Orthopedic Surgery  This  document was dictated using Conservation officer, historic buildings. A reasonable attempt at proof reading has been made to minimize errors.

## 2024-02-16 ENCOUNTER — Encounter: Payer: Self-pay | Admitting: Sports Medicine

## 2024-02-16 ENCOUNTER — Ambulatory Visit (INDEPENDENT_AMBULATORY_CARE_PROVIDER_SITE_OTHER): Admitting: Sports Medicine

## 2024-02-16 ENCOUNTER — Other Ambulatory Visit: Payer: Self-pay

## 2024-02-16 DIAGNOSIS — M7750 Other enthesopathy of unspecified foot: Secondary | ICD-10-CM | POA: Diagnosis not present

## 2024-02-16 DIAGNOSIS — M25572 Pain in left ankle and joints of left foot: Secondary | ICD-10-CM

## 2024-02-16 DIAGNOSIS — M79672 Pain in left foot: Secondary | ICD-10-CM | POA: Diagnosis not present

## 2024-02-16 DIAGNOSIS — M76822 Posterior tibial tendinitis, left leg: Secondary | ICD-10-CM

## 2024-02-16 MED ORDER — MELOXICAM 15 MG PO TABS: 15.0000 mg | ORAL_TABLET | Freq: Every day | ORAL | 0 refills | Status: DC

## 2024-02-16 NOTE — Progress Notes (Signed)
 Patient says that about 3 weeks ago, he missed a step and landed hard on the left foot. He says that he had significant pain in the medial ankle, posterior to the medial malleolus. He has had improvement in his pain, although it has not gone away completely. He has taken Ibuprofen only as needed, and uses ice as needed.

## 2024-02-16 NOTE — Progress Notes (Addendum)
 Office Visit Note   Patient: Ronnie Bailey           Date of Birth: [birth year]           MRN: [id number] Visit Date: 02/16/2024              Requested by: Ronnie Elspeth, MD [address],  [place] [address] PCP: Ronnie Alm RAMAN, PA-C  Medical Resident, Sports Medicine Fellow - Attending Physician Addendum:   I have independently interviewed and examined the patient myself. I have discussed the above with the original author and agree with their documentation. My edits for correction/addition/clarification have been made, see any changes above and below.   In summary, pleasant 43 year-old male with 2-week history of medial ankle pain after stepping wrong and landing hard into the hindfoot.  We did perform musculoskeletal ultrasound which shows posterior tibial tendon tendinitis and some mild reciprocal changes off the Achilles but no evidence of tearing.  For the inflammatory component we will start him on meloxicam 15 mg daily x 2 weeks, may then transition to as needed.  Activity modifications discussed.  He may use ankle compression sleeve if finds comfortable.  Once improving he will slowly start returning to physical activity starting with lifting/weights and then other impact activity with running, biking, etc.  He will follow-up with us  only if he is not improving over the next 4 to 6 weeks.  Lonell Sprang, DO Primary Care Sports Medicine Physician  [facility] - Orthopedics   Assessment & Plan: Visit Diagnoses:  1. Pain in left ankle and joints of left foot   2. Posterior tibial tendinitis, left leg   3. Enthesopathy of ankle    Plan: Previous x-rays reviewed.  No signs of any calcaneal spurring, small enthesophyte change at achilles insertion. No acute fracture noted.  I discussed with patient that his pain is likely occurring due to some tibialis posterior inflammation.  I suspect the numbness and burning sensation patient had previously was due  to the tibial nerve over this area.  Ultrasound scan of this area also does show some hypoechoic changes consistent with effusion/inflammation of the peroneal tendons distally.  Will go ahead and do meloxicam daily for the next 2 weeks. Advised patient to continue wearing his compression sleeve as long as there is no nerve pain.  Patient advised to follow-up as needed.  Follow-Up Instructions: Return in about 6 weeks (around 03/29/2024), or if symptoms worsen or fail to improve.   Orders:  Orders Placed This Encounter  Procedures   US  Extrem Low Left Ltd   Meds ordered this encounter  Medications   meloxicam (MOBIC) 15 MG tablet    Sig: Take 1 tablet (15 mg total) by mouth daily.    Dispense:  21 tablet    Refill:  0      Procedures: No procedures performed   Clinical Data: No additional findings.   Subjective: Chief Complaint  Patient presents with   Left Ankle - Pain    Patient is here for follow-up of left medial sided ankle pain.  Patient states that approximately 3 weeks ago he was in [place] and missed a step and had some pain on his left foot.  Patient is with significant he can barely walk.  Patient saw orthopedic surgeon who recommended follow-up for shockwave therapy because at that time he had calcaneal pain as well as some Achilles tendinitis.  Patient notes that the pain is slowly migrated.  Patient notes that he was wearing a compression sleeve which helped with some of the ankle pain on the left side but notes that there was some numbness and tingling burning over 1 specific spot on the medial ankle so he stopped using the sleeve.  Patient notes that the pain has improved significantly and he states it is anywhere between a 3-4 out of 10.  Patient states that the most pain is located on the medial aspect of the foot right behind malleolus    Review of Systems   Objective:  Physical Exam  Ortho Exam Inspection reveals no gross abnormalities.  There is no  tenderness to palpation over the plantar fascia distally.  Mild tenderness to palpation over its insertion on the calcaneus.  No tenderness over the Achilles tendon distally or at the myotendinous junction.  There is some tenderness over the myotendinous junction of the tibialis posterior.  Tinel's is negative over the tibial nerve.  Mild tenderness to palpation over the tibialis posterior tendon itself.  No pain or difficulty with range of motion.  Strength is 5 out of 5 with plantar and dorsiflexion. Specialty Comments:  No specialty comments available.  Imaging: US  Extrem Low Left Ltd Result Date: 02/16/2024 Limited musculoskeletal ultrasound of the left ankle and foot was performed today, which demonstrates hypoechoic changes of the posterior tibialis tendon in the mid and distal aspect.  There is no acute tear noted, but tenosynovitis is present with fluid within the tendon sheath and hyperemia.  This is most notable at the intersection distal to the medial malleolus and at the level of the navicular.  Neurovascular bundle appears appropriate.  Flexor digitorum appears appropriate without any tearing.  Achilles tendon has some mild swelling as well as a some spurring off the superior calcaneus but no tearing.  Myotendinous junction of the Achilles is appropriate without any tearing.  Thickness of the Achilles tendon is appropriate.  Plantar fascia appears appropriate thickness is appropriate.  *Independent review and interpretation of three-view left foot x-ray including AP, oblique and lateral film was performed by myself today.  There is mild first MTP degenerative spurring and a dorsal spur off the navicular without significant midfoot arthritic change.  No ankle joint effusion.  Early enthesophyte change at the insertion of the superior calcaneus.  No acute fracture noted.  Narrative & Impression  CLINICAL DATA:  Stepped wrong yesterday.  Left foot pain.   EXAM: LEFT FOOT - COMPLETE 3+ VIEW    COMPARISON:  None Available.   FINDINGS: Minimal lateral great toe metatarsophalangeal degenerative spurring without significant joint space narrowing. Minimal chronic enthesopathic change at the Achilles tendon insertion on the calcaneus. Mild posterior dorsal navicular degenerative spurring. No acute fracture or dislocation.   IMPRESSION: Minimal great toe metatarsophalangeal osteoarthritis. No acute fracture.     Electronically Signed   By: Tanda Lyons M.D.   On: 02/11/2024 17:03     PMFS History: Patient Active Problem List   Diagnosis Date Noted   Essential hypertension, benign 07/10/2020   Hyperlipidemia 07/10/2020   Chronic bilateral low back pain without sciatica 06/29/2018   Routine general medical examination at a health care facility 04/22/2018   Family history of macular degeneration 04/22/2018   Vaccine counseling 04/22/2018   Abnormal EKG 04/22/2018   Family history of premature coronary artery disease 12/27/2014   Obesity 12/27/2014   Past Medical History:  Diagnosis Date   Allergic rhinitis    Asthma    childhood mostly   Family  history of macular degeneration    Family history of premature CAD    Headache    occasional   Hyperlipidemia    Hypertension    related to obesity at 290lb   Vitiligo    Wears glasses     Family History  Problem Relation Age of Onset   Heart disease Mother        valve disease   Diabetes Mother    Hypotension Mother    Other Mother        macular disease, genetic, somewhat rare   Arthritis Mother    Heart disease Father 81       MI   Hypertension Father    Vitiligo Father    AAA (abdominal aortic aneurysm) Father    Hypertension Brother    Other Brother        eye disease   Hypertension Brother    Cancer Brother        skin   Kidney disease Maternal Grandmother    Diabetes Maternal Grandfather    Stomach cancer Maternal Grandfather    Cancer Maternal Grandfather        stomach   Alzheimer's disease  Paternal Grandmother    Colon cancer Neg Hx    Esophageal cancer Neg Hx    Rectal cancer Neg Hx    Pancreatic cancer Neg Hx     Past Surgical History:  Procedure Laterality Date   DENTAL SURGERY     LACERATION REPAIR     VASECTOMY  10/2020   Social History   Occupational History   Not on file  Tobacco Use   Smoking status: Former    Current packs/day: 0.00    Average packs/day: 1 pack/day for 10.0 years (10.0 ttl pk-yrs)    Types: Cigarettes    Start date: 06/25/1997    Quit date: 06/26/2007    Years since quitting: 16.6   Smokeless tobacco: Never  Vaping Use   Vaping status: Never Used  Substance and Sexual Activity   Alcohol use: Yes    Alcohol/week: 5.0 standard drinks of alcohol    Types: 1 Glasses of wine, 2 Cans of beer, 2 Shots of liquor per week   Drug use: Yes    Types: Marijuana   Sexual activity: Yes    Partners: Female

## 2024-03-14 ENCOUNTER — Other Ambulatory Visit: Payer: Self-pay | Admitting: Sports Medicine

## 2024-04-06 ENCOUNTER — Ambulatory Visit: Admitting: Medical

## 2024-04-06 VITALS — HR 67 | Temp 97.6°F | Wt 261.2 lb

## 2024-04-06 DIAGNOSIS — I889 Nonspecific lymphadenitis, unspecified: Secondary | ICD-10-CM

## 2024-04-06 LAB — CBC WITH DIFFERENTIAL/PLATELET
Basophils Absolute: 0 x10E3/uL (ref 0.0–0.2)
Basos: 1 %
EOS (ABSOLUTE): 0.2 x10E3/uL (ref 0.0–0.4)
Eos: 3 %
Hematocrit: 47.5 % (ref 37.5–51.0)
Hemoglobin: 15.5 g/dL (ref 13.0–17.7)
Immature Grans (Abs): 0 x10E3/uL (ref 0.0–0.1)
Immature Granulocytes: 0 %
Lymphocytes Absolute: 1.8 x10E3/uL (ref 0.7–3.1)
Lymphs: 25 %
MCH: 28.8 pg (ref 26.6–33.0)
MCHC: 32.6 g/dL (ref 31.5–35.7)
MCV: 88 fL (ref 79–97)
Monocytes Absolute: 0.4 x10E3/uL (ref 0.1–0.9)
Monocytes: 6 %
Neutrophils Absolute: 4.8 x10E3/uL (ref 1.4–7.0)
Neutrophils: 65 %
Platelets: 291 x10E3/uL (ref 150–450)
RBC: 5.38 x10E6/uL (ref 4.14–5.80)
RDW: 13.4 % (ref 11.6–15.4)
WBC: 7.3 x10E3/uL (ref 3.4–10.8)

## 2024-04-06 NOTE — Progress Notes (Signed)
 Subjective:  Ronnie Bailey is a 43 y.o. male who presents for Chief Complaint  Patient presents with   Acute Visit    Lump right side of neck, painful. Feels like 2 small blueberries. Noticed its 2 weeks     Here for lump in right side of neck x 2 weeks.  May have gotten slightly smaller recently.  Its annoying, uncomfortable.   No fever, but a few weeks ago may have had an earache/ear infection but that resolved.  No recent body aches, chills, or change in appetite.   Had some phlegm in recent weeks, but no distinct URI/cold or illness.  No other aggravating or relieving factors.    No other c/o.  Past Medical History:  Diagnosis Date   Allergic rhinitis    Asthma    childhood mostly   Family history of macular degeneration    Family history of premature CAD    Headache    occasional   Hyperlipidemia    Hypertension    related to obesity at 290lb   Vitiligo    Wears glasses    Current Outpatient Medications on File Prior to Visit  Medication Sig Dispense Refill   aspirin  EC 81 MG tablet Take 1 tablet (81 mg total) by mouth daily. 90 tablet 3   atorvastatin  (LIPITOR) 10 MG tablet Take 1 tablet (10 mg total) by mouth every other day. 90 tablet 3   valsartan -hydrochlorothiazide  (DIOVAN -HCT) 160-12.5 MG tablet Take 1 tablet by mouth daily. 90 tablet 3   No current facility-administered medications on file prior to visit.     The following portions of the patient's history were reviewed and updated as appropriate: allergies, current medications, past family history, past medical history, past social history, past surgical history and problem list.  ROS Otherwise as in subjective above  Objective: Pulse 67   Temp 97.6 F (36.4 C)   Wt 261 lb 3.2 oz (118.5 kg)   SpO2 99%   BMI 32.65 kg/m   Wt Readings from Last 3 Encounters:  04/06/24 261 lb 3.2 oz (118.5 kg)  11/04/23 259 lb 3.2 oz (117.6 kg)  01/14/23 255 lb (115.7 kg)    General appearance: alert, no distress,  well developed, well nourished HEENT: normocephalic, sclerae anicteric, conjunctiva pink and moist, TMs pearly, nares patent, no discharge or erythema, pharynx normal Oral cavity: MMM, no lesions Neck: supple, few small sub centimeter shotty right-sided posterior cervical lymph nodes, otherwise no lymphadenopathy, no thyromegaly, no masses Chest and axilla with no obvious lymphadenopathy or mass    Assessment: Encounter Diagnosis  Name Primary?   Lymphadenitis Yes     Plan: We discussed findings, possible differential.  Reassured these lymph nodes being small and likely just reactive.  CBC today at his request.  If lymph nodes enlarge or get bigger or persistent over the next few weeks or new symptoms then let me know or get reevaluated.  Ronnie Bailey was seen today for acute visit.  Diagnoses and all orders for this visit:  Lymphadenitis -     CBC with Differential/Platelet    Follow up: Pending lab

## 2024-04-07 ENCOUNTER — Ambulatory Visit: Payer: Self-pay | Admitting: Medical

## 2024-04-07 NOTE — Progress Notes (Signed)
 Results to MyChart

## 2024-04-19 ENCOUNTER — Ambulatory Visit: Admitting: Medical

## 2024-05-04 ENCOUNTER — Encounter: Payer: Self-pay | Admitting: Medical

## 2024-05-04 ENCOUNTER — Other Ambulatory Visit: Payer: Self-pay | Admitting: Medical

## 2024-05-04 DIAGNOSIS — I889 Nonspecific lymphadenitis, unspecified: Secondary | ICD-10-CM

## 2024-05-09 ENCOUNTER — Telehealth: Payer: Self-pay | Admitting: Medical

## 2024-05-09 NOTE — Telephone Encounter (Unsigned)
 Copied from CRM [id number]. Topic: General - Other >> May 09, 2024  4:24 PM Kevelyn M wrote: Reason for CRM:  imaging because they had to cancel the patient's appointment because his insurance is still pending.

## 2024-05-10 ENCOUNTER — Inpatient Hospital Stay: Admission: RE | Admit: 2024-05-10 | Source: Ambulatory Visit

## 2024-05-10 NOTE — Telephone Encounter (Signed)
 Caryn- do we know when insurance will update as Insurance is still pending.

## 2024-05-12 ENCOUNTER — Encounter: Payer: Self-pay | Admitting: Medical

## 2024-05-13 ENCOUNTER — Ambulatory Visit
Admission: RE | Admit: 2024-05-13 | Discharge: 2024-05-13 | Disposition: A | Discharge status: Home / Self Care | Source: Ambulatory Visit | Attending: Medical | Admitting: Medical

## 2024-05-13 ENCOUNTER — Encounter: Payer: Self-pay | Admitting: Radiology

## 2024-05-13 DIAGNOSIS — I889 Nonspecific lymphadenitis, unspecified: Secondary | ICD-10-CM

## 2024-05-13 MED ORDER — IOPAMIDOL (ISOVUE-300) INJECTION 61%
75.0000 mL | Freq: Once | INTRAVENOUS | Status: AC | PRN
  Administered 2024-05-13: 75 mL via INTRAVENOUS

## 2024-05-17 ENCOUNTER — Other Ambulatory Visit: Payer: Self-pay | Admitting: Medical

## 2024-05-17 ENCOUNTER — Ambulatory Visit: Payer: Self-pay | Admitting: Medical

## 2024-05-17 DIAGNOSIS — R591 Generalized enlarged lymph nodes: Secondary | ICD-10-CM

## 2024-05-17 NOTE — Progress Notes (Signed)
 Scan shows enlarged lymph nodes.  The recommendation was possible biopsy if the lymph nodes remain enlarged after another 4 to 6 weeks.  However given the timeframe that you have already experienced these I am going to refer you to ENT for consult.  By the time you see them if the lymph nodes have resolved then no worries.  Otherwise if the nodes continue to stay enlarged you may need a biopsy.  Thus lets go ahead and make the referral  Expect a phone call about scheduling

## 2024-06-17 ENCOUNTER — Encounter (INDEPENDENT_AMBULATORY_CARE_PROVIDER_SITE_OTHER): Payer: Self-pay

## 2024-06-23 ENCOUNTER — Institutional Professional Consult (permissible substitution) (INDEPENDENT_AMBULATORY_CARE_PROVIDER_SITE_OTHER): Admitting: Otolaryngology

## 2024-06-27 ENCOUNTER — Encounter: Payer: Self-pay | Admitting: Radiology

## 2024-06-28 ENCOUNTER — Encounter (HOSPITAL_COMMUNITY): Payer: Self-pay

## 2024-06-28 ENCOUNTER — Encounter (INDEPENDENT_AMBULATORY_CARE_PROVIDER_SITE_OTHER): Payer: Self-pay | Admitting: Otolaryngology

## 2024-06-28 ENCOUNTER — Ambulatory Visit (INDEPENDENT_AMBULATORY_CARE_PROVIDER_SITE_OTHER): Admitting: Otolaryngology

## 2024-06-28 VITALS — BP 133/82 | HR 66 | Temp 98.2°F | Ht 75.0 in | Wt 266.0 lb

## 2024-06-28 DIAGNOSIS — R591 Generalized enlarged lymph nodes: Secondary | ICD-10-CM | POA: Diagnosis not present

## 2024-06-28 MED ORDER — AZITHROMYCIN 250 MG PO TABS: ORAL_TABLET | ORAL | 0 refills | Status: AC

## 2024-06-28 NOTE — Progress Notes (Signed)
 Jenna Cordella LABOR, MD  Michaelene Setter PROCEDURE / BIOPSY REVIEW Date: 06/28/24  Requested Biopsy site: right neck Reason for request: lymph node enlargement Imaging review: Best seen on CT  Decision: Approved Imaging modality to perform: Ultrasound Schedule with: No sedation / Local anesthetic Schedule for: Any VIR  Additional comments: @Schedulers .  Please contact me with questions, concerns, or if issue pertaining to this request arise.  Cordella LABOR Jenna, MD [facility]       Previous Messages    ----- Message ----- From: Kina Shiffman Sent: 06/28/2024  10:50 AM EST To: Albion Weatherholtz; Ir Procedure Requests Subject: US  FNA BIOPSY SOFT TISSUE 1ST LESION          Procedure : US  FNA BIOPSY SOFT TISSUE 1ST LESION  Reason : right neck lymphadenopthy Dx: Lymphadenopathy of head and neck [R59.1 (ICD-10-CM)]    History : CT soft tissue neck w/  Provider : Roark Rush, MD  Contact : [phone]

## 2024-06-28 NOTE — Progress Notes (Signed)
 Reason for Consult: Lymphadenopathy Referring Physician: [name] is an 43 y.o. male.  HPI: History of onset of right sided lymphadenopathy back in August.  They hurt in the beginning.  He was seen by primary care and they observed it.  He now has only intermittent discomfort.  He does have 3 cats.  He does not have fever, night sweats, chills, or weight loss.  He does not have any dysphagia or odynophagia.  No hoarseness.  No sores in his mouth.  He does remember he did have some form of an upper respiratory infection when this started.  He has not had any antibiotic.  Past Medical History:  Diagnosis Date   Allergic rhinitis    Asthma    childhood mostly   Family history of macular degeneration    Family history of premature CAD    Headache    occasional   Hyperlipidemia    Hypertension    related to obesity at 290lb   Vitiligo    Wears glasses     Past Surgical History:  Procedure Laterality Date   DENTAL SURGERY     LACERATION REPAIR     VASECTOMY  10/2020    Family History  Problem Relation Age of Onset   Heart disease Mother        valve disease   Diabetes Mother    Hypotension Mother    Other Mother        macular disease, genetic, somewhat rare   Arthritis Mother    Heart disease Father 43       MI   Hypertension Father    Vitiligo Father    AAA (abdominal aortic aneurysm) Father    Hypertension Brother    Other Brother        eye disease   Hypertension Brother    Cancer Brother        skin   Kidney disease Maternal Grandmother    Diabetes Maternal Grandfather    Stomach cancer Maternal Grandfather    Cancer Maternal Grandfather        stomach   Alzheimer's disease Paternal Grandmother    Colon cancer Neg Hx    Esophageal cancer Neg Hx    Rectal cancer Neg Hx    Pancreatic cancer Neg Hx     Social History:  reports that he quit smoking about 17 years ago. His smoking use included cigarettes. He started smoking about 27 years ago.  He has a 10 pack-year smoking history. He has never used smokeless tobacco. He reports current alcohol use of about 5.0 standard drinks of alcohol per week. He reports current drug use. Drug: Marijuana.  Allergies:  Allergies  Allergen Reactions   Oysters [Shellfish Allergy] Diarrhea    Stomach pain and diarrhea    Medications: I have reviewed the patient's current medications.  No results found for this or any previous visit (from the past 48 hours).  No results found.  ROS Blood pressure 133/82, pulse 66, temperature 98.2 F (36.8 C), height 6' 3 (1.905 m), weight 266 lb (120.7 kg), SpO2 96%. Physical Exam Constitutional:      Appearance: Normal appearance.  HENT:     Head: Normocephalic and atraumatic.     Right Ear: Tympanic membrane is without lesions and middle ear aerated, ear canal and external ear normal.     Left Ear: Tympanic membrane is without lesions and middle ear aerated, ear canal and external ear normal.     Nose: Nose  normal. Turbinates with mild hypertrophy, No significant swelling or masses.     Oral cavity/oropharynx: Mucous membranes are moist. No lesions or masses    Larynx: normal voice. Mirror attempted without success    Eyes:     Extraocular Movements: Extraocular movements intact.     Conjunctiva/sclera: Conjunctivae normal.     Pupils: Pupils are equal, round, and reactive to light.  Cardiovascular:     Rate and Rhythm: Normal rate.  Pulmonary:     Effort: Pulmonary effort is normal.  Musculoskeletal:     Cervical back: Normal range of motion and neck supple. No rigidity.  Lymphadenopathy:     Cervical: There are 2 palpable very mobile nodes that are less than 2 cm probably about 1-1.5.  Nontender.  These are located in the right inferior posterior triangle.  No  masses.salivary glands without lesions. .  Neurological:     Mental Status: He is alert. CN 2-12 intact. No nystagmus      Assessment/Plan: Lymphadenopathy-he has right sided  level 5 nodes that are mobile and relatively small.  He has no fever, night sweats, or chills.  No voice change.  These hurt in the beginning so I do think an antibiotic is appropriate and he also has cats.  Since this has been present for about 3 months I am going to proceed with a fine-needle aspiration ultrasound-guided and he will follow-up after that study  Norleen Notice 06/28/2024, 8:07 AM

## 2024-07-24 ENCOUNTER — Encounter (HOSPITAL_COMMUNITY): Payer: Self-pay | Admitting: Radiology

## 2024-07-26 ENCOUNTER — Other Ambulatory Visit (INDEPENDENT_AMBULATORY_CARE_PROVIDER_SITE_OTHER): Payer: Self-pay | Admitting: Otolaryngology

## 2024-07-26 ENCOUNTER — Ambulatory Visit (HOSPITAL_COMMUNITY)
Admission: RE | Admit: 2024-07-26 | Discharge: 2024-07-26 | Disposition: A | Discharge status: Home / Self Care | Source: Ambulatory Visit | Attending: Otolaryngology | Admitting: Otolaryngology

## 2024-07-26 DIAGNOSIS — R591 Generalized enlarged lymph nodes: Secondary | ICD-10-CM

## 2024-07-26 DIAGNOSIS — R590 Localized enlarged lymph nodes: Secondary | ICD-10-CM | POA: Diagnosis not present

## 2024-07-26 MED ORDER — LIDOCAINE HCL (PF) 1 % IJ SOLN
4.0000 mL | Freq: Once | INTRAMUSCULAR | Status: AC
  Administered 2024-07-26: 4 mL via INTRADERMAL

## 2024-07-26 NOTE — Procedures (Signed)
 Interventional Radiology Procedure:   Indications: Right cervical lymphadenopathy  Procedure: US  guided FNA and core biopsy of right cervical lymph node  Findings: 5 FNAs and 4 core biopsies from enlarged right cervical lymph node   Complications: None     EBL: Minimal  Plan:  Discharge to home   Virgen Belland R. Philip, MD  Pager: [phone]

## 2024-07-28 LAB — SURGICAL PATHOLOGY

## 2024-07-28 LAB — CYTOLOGY - NON PAP

## 2024-08-02 ENCOUNTER — Other Ambulatory Visit (INDEPENDENT_AMBULATORY_CARE_PROVIDER_SITE_OTHER): Payer: Self-pay | Admitting: Otolaryngology

## 2024-08-02 DIAGNOSIS — R591 Generalized enlarged lymph nodes: Secondary | ICD-10-CM

## 2024-08-02 NOTE — Progress Notes (Signed)
 I talk to him about his biopsy results which shows that he has histiocytes and some granulomata like activity.  This can be a lymphoma but also could be a cat scratch fever type scenario.  These are small nodes and not typically consistent of the presentation of lymphoma.  We talked about the options of an excisional biopsy but right now he would prefer to check the titer for cat scratch and wait a little longer which I do not think is a bad idea.  He will follow-up at the end of the month 1 January and we will call him with the results of the cat scratch titer

## 2024-08-05 ENCOUNTER — Encounter (INDEPENDENT_AMBULATORY_CARE_PROVIDER_SITE_OTHER): Payer: Self-pay

## 2024-08-05 ENCOUNTER — Telehealth (INDEPENDENT_AMBULATORY_CARE_PROVIDER_SITE_OTHER): Payer: Self-pay

## 2024-08-05 NOTE — Telephone Encounter (Signed)
 Patient called asking about the lab work Dr.Byers had ordered for him. I looked at the order and confirmed with the patient that the order was sent to [facility]. I looked up the closest one to his location and let him know if he had any questions about his insurance he can either call his insurance or with [facility] if you make an appointment on line it has a section that goes over your insurance. Patient understood and stated he would get the labs done as soon as possible.

## 2024-08-10 LAB — BARTONELLA HENSELAE ANTIBODY (IGG) WITH REFLEX TO TITER: B. henselae AB (IgG) Screen: NEGATIVE

## 2024-08-11 ENCOUNTER — Encounter (INDEPENDENT_AMBULATORY_CARE_PROVIDER_SITE_OTHER): Payer: Self-pay | Admitting: Otolaryngology

## 2024-08-16 ENCOUNTER — Telehealth (INDEPENDENT_AMBULATORY_CARE_PROVIDER_SITE_OTHER): Payer: Self-pay

## 2024-08-16 NOTE — Telephone Encounter (Signed)
 Can we call him to get scheduled for a follow up next available with Dr. Roark

## 2024-08-24 ENCOUNTER — Telehealth (INDEPENDENT_AMBULATORY_CARE_PROVIDER_SITE_OTHER): Payer: Self-pay | Admitting: Otolaryngology

## 2024-08-24 NOTE — Telephone Encounter (Signed)
 Left message for patient to call back to schedule follow-up appointment with Dr. Roark.

## 2024-08-26 ENCOUNTER — Telehealth (INDEPENDENT_AMBULATORY_CARE_PROVIDER_SITE_OTHER): Payer: Self-pay | Admitting: Otolaryngology

## 2024-08-26 NOTE — Telephone Encounter (Signed)
 Left message for patient to call back to schedule follow-up appointment with Dr. Roark on the morning of 08/31/2024

## 2024-08-31 ENCOUNTER — Ambulatory Visit (INDEPENDENT_AMBULATORY_CARE_PROVIDER_SITE_OTHER): Admitting: Otolaryngology

## 2024-08-31 ENCOUNTER — Encounter (INDEPENDENT_AMBULATORY_CARE_PROVIDER_SITE_OTHER): Payer: Self-pay | Admitting: Otolaryngology

## 2024-08-31 VITALS — BP 128/82 | HR 75 | Temp 97.8°F

## 2024-08-31 DIAGNOSIS — H903 Sensorineural hearing loss, bilateral: Secondary | ICD-10-CM

## 2024-08-31 DIAGNOSIS — R591 Generalized enlarged lymph nodes: Secondary | ICD-10-CM | POA: Diagnosis not present

## 2024-08-31 DIAGNOSIS — M26609 Unspecified temporomandibular joint disorder, unspecified side: Secondary | ICD-10-CM | POA: Diagnosis not present

## 2024-08-31 NOTE — Progress Notes (Signed)
 Reason for Consult: Follow-up Referring Physician: None  Ronnie Bailey is an 44 y.o. male.  HPI: He is doing well but had a new problem that he complained about today with popping and cracking in the ears that has been going on for years.  He says it occurs when he opens his mouth or chews or yawns.  It does not seem to create any pain.  He eats perhaps a little worse on the right than the left.  He also has a tinnitus sound in the right ear that occurs intermittently.  1 time he noticed there was a clicking and a fan overhead and he could not hear the clicking sound is well out of the right ear than the left.  That is the only indicator he has any change in his hearing.  No vertigo.  He is still watching the nodes in his right neck.  His cat scratch titer was negative.  He has no fever, night sweats, chills, or weight loss.  He does not feel unwell and really is not decided yet whether he wants to proceed with a further evaluation of these nodes.  They have not changed in size to the larger.  Past Medical History:  Diagnosis Date   Allergic rhinitis    Asthma    childhood mostly   Family history of macular degeneration    Family history of premature CAD    Headache    occasional   Hyperlipidemia    Hypertension    related to obesity at 290lb   Vitiligo    Wears glasses     Past Surgical History:  Procedure Laterality Date   DENTAL SURGERY     LACERATION REPAIR     VASECTOMY  10/2020    Family History  Problem Relation Age of Onset   Heart disease Mother        valve disease   Diabetes Mother    Hypotension Mother    Other Mother        macular disease, genetic, somewhat rare   Arthritis Mother    Heart disease Father 55       MI   Hypertension Father    Vitiligo Father    AAA (abdominal aortic aneurysm) Father    Hypertension Brother    Other Brother        eye disease   Hypertension Brother    Cancer Brother        skin   Kidney disease Maternal Grandmother     Diabetes Maternal Grandfather    Stomach cancer Maternal Grandfather    Cancer Maternal Grandfather        stomach   Alzheimer's disease Paternal Grandmother    Colon cancer Neg Hx    Esophageal cancer Neg Hx    Rectal cancer Neg Hx    Pancreatic cancer Neg Hx     Social History:  reports that he quit smoking about 17 years ago. His smoking use included cigarettes. He started smoking about 27 years ago. He has a 10 pack-year smoking history. He has never used smokeless tobacco. He reports current alcohol use of about 5.0 standard drinks of alcohol per week. He reports current drug use. Drug: Marijuana.  Allergies: Allergies[1]   No results found for this or any previous visit (from the past 48 hours).  No results found.  ROS Blood pressure 128/82, pulse 75, temperature 97.8 F (36.6 C), SpO2 97%. Physical Exam Constitutional:      Appearance: Normal appearance.  HENT:     Head: Normocephalic and atraumatic.     Right Ear: Tympanic membrane is without lesions and middle ear aerated, ear canal and external ear normal.     Left Ear: Tympanic membrane is without lesions and middle ear aerated, ear canal and external ear normal.  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    Nose: Nose without deviation of septum.  Turbinates with mild hypertrophy, No significant swelling or masses.     Oral cavity/oropharynx: Mucous membranes are moist. No lesions or masses    Larynx: normal voice. Mirror attempted without success    Eyes:     Extraocular Movements: Extraocular movements intact.     Conjunctiva/sclera: Conjunctivae normal.     Pupils: Pupils are equal, round, and reactive to light.  Cardiovascular:     Rate and Rhythm: Normal rate.  Pulmonary:     Effort: Pulmonary effort is normal.  Musculoskeletal:     Cervical back: Normal range of motion and neck supple. No rigidity.  Lymphadenopathy:     Cervical: Still with right posterior inferior neck nodes that are combined together  palpable and about 1 cm apiece.  They are very mobile no cervical adenopathy or masses.salivary glands without lesions. .     Salivary glands- no mass or swelling Neurological:     Mental Status: He is alert. CN 2-12 intact. No nystagmus      Assessment/Plan: Sensorineural hearing loss-since he does complain about some hearing loss he does want to proceed with an audiogram and that will be ordered.  Will discussed the findings.  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  Oral surgery would be the pursuit to treat this.  Lymphadenopathy-these nodes are persisting but not enlarging.  He is not ill feeling and he still wants to contemplate whether he wants an open biopsy.  He is willing to come back in a couple of months to discuss the problem and reexamination.  We again entertained and discussed the issue with lymphoma.  Norleen Notice 08/31/2024, 9:22 AM        [1]  Allergies Allergen Reactions   Oysters [Shellfish Allergy] Diarrhea    Stomach pain and diarrhea

## 2024-11-10 ENCOUNTER — Encounter: Payer: Self-pay | Admitting: Medical
